# Patient Record
Sex: Female | Born: 1965 | Race: Black or African American | Hispanic: No | Marital: Married | Smoking: Never smoker
Health system: Southern US, Community
[De-identification: ages and names within clinical notes are randomized; demographics above are authoritative.]

## PROBLEM LIST (undated history)

## (undated) DIAGNOSIS — K219 Gastro-esophageal reflux disease without esophagitis: Secondary | ICD-10-CM

## (undated) DIAGNOSIS — I10 Essential (primary) hypertension: Secondary | ICD-10-CM

## (undated) DIAGNOSIS — N189 Chronic kidney disease, unspecified: Secondary | ICD-10-CM

## (undated) DIAGNOSIS — R51 Headache: Secondary | ICD-10-CM

## (undated) DIAGNOSIS — Z1231 Encounter for screening mammogram for malignant neoplasm of breast: Secondary | ICD-10-CM

## (undated) DIAGNOSIS — N200 Calculus of kidney: Secondary | ICD-10-CM

## (undated) HISTORY — PX: ABDOMINAL HYSTERECTOMY: SHX81

---

## 1997-12-24 ENCOUNTER — Other Ambulatory Visit: Admission: RE | Admit: 1997-12-24 | Discharge: 1997-12-24 | Payer: Self-pay | Admitting: Obstetrics and Gynecology

## 1998-01-02 ENCOUNTER — Observation Stay (HOSPITAL_COMMUNITY): Admission: AD | Admit: 1998-01-02 | Discharge: 1998-01-02 | Payer: Self-pay | Admitting: *Deleted

## 1998-07-17 ENCOUNTER — Inpatient Hospital Stay (HOSPITAL_COMMUNITY): Admission: AD | Admit: 1998-07-17 | Discharge: 1998-07-19 | Payer: Self-pay | Admitting: Obstetrics and Gynecology

## 1998-07-25 ENCOUNTER — Inpatient Hospital Stay (HOSPITAL_COMMUNITY): Admission: AD | Admit: 1998-07-25 | Discharge: 1998-07-25 | Payer: Self-pay | Admitting: Obstetrics and Gynecology

## 1998-08-19 ENCOUNTER — Other Ambulatory Visit: Admission: RE | Admit: 1998-08-19 | Discharge: 1998-08-19 | Payer: Self-pay | Admitting: Obstetrics and Gynecology

## 1999-10-06 ENCOUNTER — Other Ambulatory Visit: Admission: RE | Admit: 1999-10-06 | Discharge: 1999-10-06 | Payer: Self-pay | Admitting: Obstetrics and Gynecology

## 2000-10-09 ENCOUNTER — Other Ambulatory Visit: Admission: RE | Admit: 2000-10-09 | Discharge: 2000-10-09 | Payer: Self-pay | Admitting: Obstetrics and Gynecology

## 2000-10-16 ENCOUNTER — Encounter: Payer: Self-pay | Admitting: Obstetrics and Gynecology

## 2000-10-16 ENCOUNTER — Ambulatory Visit (HOSPITAL_COMMUNITY): Admission: RE | Admit: 2000-10-16 | Discharge: 2000-10-16 | Payer: Self-pay | Admitting: Obstetrics and Gynecology

## 2000-11-21 ENCOUNTER — Ambulatory Visit (HOSPITAL_COMMUNITY): Admission: RE | Admit: 2000-11-21 | Discharge: 2000-11-21 | Payer: Self-pay | Admitting: Obstetrics and Gynecology

## 2000-11-21 ENCOUNTER — Encounter (INDEPENDENT_AMBULATORY_CARE_PROVIDER_SITE_OTHER): Payer: Self-pay

## 2001-06-21 ENCOUNTER — Ambulatory Visit (HOSPITAL_COMMUNITY): Admission: RE | Admit: 2001-06-21 | Discharge: 2001-06-21 | Payer: Self-pay | Admitting: Obstetrics and Gynecology

## 2001-10-23 ENCOUNTER — Inpatient Hospital Stay (HOSPITAL_COMMUNITY): Admission: AD | Admit: 2001-10-23 | Discharge: 2001-10-25 | Payer: Self-pay | Admitting: Family Medicine

## 2001-10-23 ENCOUNTER — Encounter: Payer: Self-pay | Admitting: Family Medicine

## 2001-11-26 ENCOUNTER — Other Ambulatory Visit: Admission: RE | Admit: 2001-11-26 | Discharge: 2001-11-26 | Payer: Self-pay | Admitting: Obstetrics and Gynecology

## 2002-09-19 ENCOUNTER — Encounter: Payer: Self-pay | Admitting: Family Medicine

## 2002-09-19 ENCOUNTER — Encounter: Admission: RE | Admit: 2002-09-19 | Discharge: 2002-09-19 | Payer: Self-pay | Admitting: Family Medicine

## 2003-06-18 ENCOUNTER — Encounter: Admission: RE | Admit: 2003-06-18 | Discharge: 2003-06-18 | Payer: Self-pay | Admitting: Family Medicine

## 2004-01-12 ENCOUNTER — Emergency Department (HOSPITAL_COMMUNITY): Admission: EM | Admit: 2004-01-12 | Discharge: 2004-01-13 | Payer: Self-pay | Admitting: Emergency Medicine

## 2006-04-12 ENCOUNTER — Ambulatory Visit: Payer: Self-pay | Admitting: Orthopedic Surgery

## 2006-04-26 ENCOUNTER — Ambulatory Visit: Payer: Self-pay | Admitting: Orthopedic Surgery

## 2006-05-10 ENCOUNTER — Ambulatory Visit: Payer: Self-pay | Admitting: Orthopedic Surgery

## 2006-08-25 ENCOUNTER — Ambulatory Visit: Payer: Self-pay | Admitting: Gastroenterology

## 2006-10-10 ENCOUNTER — Ambulatory Visit: Payer: Self-pay | Admitting: Gastroenterology

## 2007-03-30 ENCOUNTER — Ambulatory Visit (HOSPITAL_COMMUNITY): Admission: RE | Admit: 2007-03-30 | Discharge: 2007-03-31 | Payer: Self-pay | Admitting: Obstetrics and Gynecology

## 2007-03-30 ENCOUNTER — Encounter (INDEPENDENT_AMBULATORY_CARE_PROVIDER_SITE_OTHER): Payer: Self-pay | Admitting: Obstetrics and Gynecology

## 2007-04-10 ENCOUNTER — Encounter: Payer: Self-pay | Admitting: Orthopedic Surgery

## 2009-04-25 HISTORY — PX: FOOT SURGERY: SHX648

## 2010-05-11 ENCOUNTER — Encounter
Admission: RE | Admit: 2010-05-11 | Discharge: 2010-05-11 | Payer: Self-pay | Source: Home / Self Care | Attending: Obstetrics and Gynecology | Admitting: Obstetrics and Gynecology

## 2010-05-26 ENCOUNTER — Encounter: Payer: Self-pay | Admitting: Obstetrics and Gynecology

## 2010-09-07 NOTE — Op Note (Signed)
Suzanne Holloway, Suzanne Holloway                ACCOUNT NO.:  [id number]   MEDICAL RECORD NO.:  [id number]          PATIENT TYPE:  OIB   LOCATION:  9308                          FACILITY:  [facility]   PHYSICIAN:  Lenoard Aden, M.D.DATE OF BIRTH:  07-Feb-1966   DATE OF PROCEDURE:  03/30/2007  DATE OF DISCHARGE:                               OPERATIVE REPORT   PREOPERATIVE DIAGNOSIS:  Symptomatic uterine fibroids with secondary  anemia and pelvic pain.   POSTOPERATIVE DIAGNOSES:  1. Symptomatic uterine fibroids with secondary anemia and pelvic pain.  2. Pelvic adhesions.  3. Enterocele.   PROCEDURES:  1. Total laparoscopic hysterectomy.  2. Lysis of adhesions.  3. McCall culdoplasty.   SURGEON:  Lenoard Aden, M.D.   ASSISTANT:  Lendon Colonel, MD.   ANESTHESIA:  General.   ESTIMATED BLOOD LOSS:  100 mL.   COMPLICATIONS:  None.   DRAINS:  Foley.   COUNTS:  Correct.   The patient to recovery in good condition.   DESCRIPTION OF PROCEDURE:  After being apprised of the risks of  anesthesia, infection, bleeding, injury to abdominal organs with need  for repair, delayed versus immediate complications to include bowel and  bladder injury, the patient brought to the operating room where she was  administered general anesthetic without complications, prepped and  draped in usual sterile fashion.  Foley catheter placed.  Rumi retractor  placed per vagina without difficulty in the standard fashion.  An  infraumbilical incision then made with a scalpel.  Veress needle placed,  opening pressure -1 noted, 3 liters CO2 insufflated without difficulty.  Trocar placed atraumatically.  Pictures taken.  Normal liver,  gallbladder bed, normal appendiceal area.  Normal posterior cul-de-sac,  anterior cul-de-sac with adhesions to the bladder flap noted.  Noted  bilateral normal ovaries and previously divided tubes are noted.  At  this time two 5 mm ports are placed bilaterally in the  midclavicular  line under transillumination and direct visualization.  The left round  ligament is grasped, ligated using the Gyrus, the bladder flap is then  extended down to the level of the bladder where dense scar tissue is  noted.  The tubo-ovarian ligament is grasped and ligated using the  Gyrus, uterine vessels skeletonized on the left and the same procedure  and done on the right whereby the round ligament is grasped, ligated  tubo-ovarian ligament is grasped, ligated with the Gyrus as well.  Uterine vessels are skeletonized.  Attention is then turned to a bladder  flap, whereby this is developed sharply using the Gyrus and Endoshears.  The bladder flap is then sharply deflected after adhesion are lysed  sharply down to the level of the Rumi cup and extended in the standard  fashion exposing the pearly white tissue in the bladder reflection.  At  this time uterine vessels are bilaterally grasped and ligated using  LigaSure and good blanching of the uterus is noted.  At this time  posterior cul-de-sac entry was made using the Gyrus spatula and  hemostasis was achieved using the Gyrus extending circumferentially  around the anterior portion  with good hemostasis.  The specimen is then  retracted out of the vagina and the cuff is made hemostatic using the  Gyrus and good hemostasis noted.  The cuff is then closed front to back  using interrupted 0 Vicryl sutures, sutured laparoscopically.  Enterocele is identified.  Uterosacral ligaments are then plicated in  the midline using a internal McCall culdoplasty suture without  difficulty.  Good hemostasis is noted.  Irrigation is accomplished.  Ureters are noted prehysterectomy and post-hysterectomy to be normal and  peristalsing bilaterally.  Both ovaries appear normal.  All instruments  are removed under direct visualization.  CO2 is released.  Incisional  bleeding is controlled using a 0 Vicryl and 4-0 Vicryl on bilateral  lower  quadrant ports.  Dermabond is placed on the infraumbilical  incision.  Specimen is weighed, weighing 200 grams and sent to  pathology.  The patient tolerates the procedure well, is awakened and  transferred to recovery in good condition.      Lenoard Aden, M.D.  Electronically Signed     RJT/MEDQ  D:  03/30/2007  T:  03/30/2007  Job:  [id number]

## 2010-09-10 NOTE — Op Note (Signed)
[facility]  Patient:    Suzanne Holloway, Suzanne Holloway                       MRN: [id number] Proc. Date: 11/21/00 Adm. Date:  [id number] Attending:  Lenoard Aden                           Operative Report  PREOPERATIVE DIAGNOSIS:       Dysmenorrhea, menorrhagia, and anemia, a                               structural lesion.  POSTOPERATIVE DIAGNOSIS:      A large submucous fibroid.  PROCEDURE:                    Diagnostic hysteroscopy with a resectoscopic                               myomectomy and dilatation and curettage.  SURGEON:                      Lenoard Aden, M.D.  ANESTHESIA:                   General.  FLUID DEFICIT:                60 cc.  COMPLICATIONS:                None.  ESTIMATED BLOOD LOSS:         50 cc.  FINDINGS:                     A large submucous anterior wall fibroid, to pathology.  The patient had normal tubal ostia, normal posterior wall.  The patient to the recovery room in good condition.  DESCRIPTION OF PROCEDURE:     After being appraised of the risks of anesthesia, infection, bleeding, uterine perforation, and a need for repair, the patient was brought to the operating room where she was administered a general anesthetic without complications.  She was prepped and draped in the usual sterile fashion.  She was catheterized until the bladder was empty. Examination under anesthesia revealed a bulky anteflexed uterus.  No adnexal masses.  Dilute Pitressin solution at 3 and 9 oclock in the cervicovaginal junction, placed without difficulty, a total of 15 cc, 20 cc in 100 cc of saline.  At this time the cervix dilated easily up to a #31 Pratt dilator. The diagnostic hysteroscope was placed.  The resection using double angle loop and multiple passes for complete resection of a large anterior wall submucous fibroid, down to the base without difficulty.  Good hemostasis was achieved. Fluid deficit was 70 cc.  Normal tubal  ostia noted.  No evidence of posterior wall lesion noted.  All instruments are removed after a D&C is performed.  The patient tolerated the procedure well and was taken to the recovery room in good condition. DD:  11/21/00 TD:  11/21/00 Job: [id number]

## 2010-09-10 NOTE — H&P (Signed)
[facility]  Patient:    Suzanne Holloway, Suzanne Holloway Visit Number: [id number] MRN: [id number]          Service Type: MED Location: 3000 [id number] [id number] Attending Physician:  Beverely Low Dictated by:   Geraldo Pitter, M.D. Admit Date:  10/23/2001 Discharge Date: 10/25/2001                           History and Physical  HISTORY OF PRESENT ILLNESS: The patient is a 45 year old female, who was first seen in the office on October 22, 2001 with complaints of not feeling well and having frequency of urination.  She states she had had a urinary tract infection in the past and this felt like such.  Examination in the office showed that her urine was positive for bacteria.  She also was noted to have CVA tenderness and some lower abdominal tenderness.  We decided to go ahead and treat her as an outpatient as she had a temperature of only 100.1 degrees at that time and draw blood cultures and do a urine and get a renal ultrasound.  Blood cultures came back positive for gram-negative rods the next day and her renal ultrasound was read as being negative.  The patient was placed on Cipro 750 mg one b.i.d. and Augmentin 850 mg b.i.d.  I received a call from her husband that she was running a TEMP of 102-103 degrees and it was therefore felt that hospitalization was appropriate.  PAST MEDICAL HISTORY:  1. Tubal ligation in 2003.  2. Myomectomy in 2002.  3. Urinary tract infection last year.  MEDICATIONS:  1. Cipro 750 mg b.i.d.  2. Augmentin 875 mg b.i.d.  ALLERGIES: Negative.  FAMILY HISTORY: Positive for hypertension in mother,  heart disease in maternal grandmother, anemia in the patient.  REVIEW OF SYSTEMS: Noncontributory.  SOCIAL HISTORY: Married with children.  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  PHYSICAL EXAMINATION:  VITAL SIGNS: Temperature 103.1 degrees, blood pressure 140/80, pulse 110.  O2 saturation 96.0%.  Respirations  18.  GENERAL: She complained that she could not get warm.  HEENT: Head normocephalic.  EOMI.  PERRL.  Discs flat.  Ears negative.  Nose negative.  Mouth negative with good dentition noted.  NECK: Supple.  No thyromegaly, no carotid bruits heard.  CHEST: Clear anterior and posterior.  BACK: Positive CVA tenderness on the left side, which was extremely tender.  NEUROLOGIC: Alert and oriented x3.  Moving all extremities.  ABDOMEN: Soft, with tenderness noted in the lower abdomen to palpation.  LABORATORY DATA: Laboratories from my office showed a WBC of 8.1, hemoglobin and hematocrit 12.2 and 39.3.  Sodium 137, potassium 4.2, chloride 102, CO2 22.  Blood culture was positive for gram-negative rods.  UA was positive for blood, positive for leukocytes +2, moderate ketones, positive for bilirubin.  Renal ultrasound done at [facility] on an outpatient basis was read as negative.  ASSESSMENT: This is a 45 year old female with probable pyelonephritis, who has failed attempted outpatient therapy.  PLAN: Admit the patient and start IVs and IV antibiotics, and monitor carefully.Dictated by:   Geraldo Pitter, M.D. Attending Physician:  Beverely Low DD:  10/24/01 TD:  10/27/01 Job: [id number]

## 2010-09-10 NOTE — Discharge Summary (Signed)
Suzanne Holloway, Suzanne Holloway                ACCOUNT NO.:  [id number]   MEDICAL RECORD NO.:  [id number]          PATIENT TYPE:  OIB   LOCATION:  9308                          FACILITY:  [facility]   PHYSICIAN:  Lenoard Aden, M.D.DATE OF BIRTH:  January 23, 1966   DATE OF ADMISSION:  03/30/2007  DATE OF DISCHARGE:  03/31/2007                               DISCHARGE SUMMARY   HISTORY OF PRESENT ILLNESS:  The patient underwent uncomplicated total  laparoscopic hysterectomy on March 30, 2007. Postoperative course  uncomplicated. Tolerated diet well. Urinated without difficulty. Foley  removed on postoperative day 1.   DISPOSITION:  To home.   DISCHARGE MEDICATIONS:  1. Tylox.  2. Iron.   FOLLOWUP:  In the office in 2 to 4 weeks.      Lenoard Aden, M.D.  Electronically Signed     RJT/MEDQ  D:  05/22/2007  T:  05/22/2007  Job:  [id number]

## 2010-09-10 NOTE — H&P (Signed)
[facility]  Patient:    Suzanne Holloway, Suzanne Holloway                       MRN: [id number] Adm. Date:  [id number] Attending:  Lenoard Aden                         History and Physical  PREOPERATIVE DIAGNOSIS:       Severe dysmenorrhea and menorrhagia and secondary anemia with saline sonohysterography suggestive of a structural lesion.  HISTORY OF PRESENT ILLNESS:   [age]-year-old African-American female, G2, P2, with a history of C-section x 2, who presents with aforementioned complaint, saline sonohysterography consistent with two endometrial masses, one is a large fundal/anterior wall fibroid, the other being a posterior wall fibroid, who presents for definitive evaluation.  PAST MEDICAL HISTORY:         Remarkable for C-section x 2.  FAMILY HISTORY:               Noncontributory.  BLOOD TYPE:                   O-positive.  SOCIAL HISTORY:               Noncontributory.  SURGICAL HISTORY:             Remarkable for resection of submucous fibroid in 1994, C-section in 1995, tonsillectomy in 1985, laparoscopies in 1990 and 1991.  PHYSICAL EXAMINATION:  GENERAL:                      Well-developed, well-nourished African-American female in no acute distress.  HEENT:                        Normal.  LUNGS:                        Clear.  HEART:                        Regular rhythm.  ABDOMEN:                      Soft, nontender.  PELVIC:                       Exam reveals a bulky anteflexed uterus, no adnexal masses.  EXTREMITIES:                  No cords.  NEUROLOGIC:                   Exam is nonfocal.  IMPRESSION:                   Dysmenorrhea and menorrhagia with secondary                               anemia and questionable structural lesion.  PLAN:                         Proceed with diagnostic hysteroscopy, resectoscopic dilatation and curettage.  Risks of anesthesia, infection, bleeding, injury to abdominal organs with need  for repair are discussed, delayed versus immediate complications to include bowel and bladder injury are noted; patient acknowledges and desires to proceed. DD:  11/21/00 TD:  11/21/00 Job: [id number]

## 2010-09-10 NOTE — Op Note (Signed)
[facility]  Patient:    Suzanne Holloway, COPPESS Visit Number: [id number] MRN: [id number]          Service Type: DSU Location: [facility] Attending Physician:  Lenoard Aden Dictated by:   Lenoard Aden, M.D. Proc. Date: 06/21/01 Admit Date:  06/21/2001   CC:         [facility]   Operative Report  PREOPERATIVE DIAGNOSES:       Desire for elective sterilization.  POSTOPERATIVE DIAGNOSES:      Desire for elective sterilization, pelvic adhesions.  PROCEDURE:                    Laparoscopic tubal sterilization, lysis of adhesions.  SURGEON:                      Lenoard Aden, M.D.  ANESTHESIA:                   General by Jean Rosenthal.  ESTIMATED BLOOD LOSS:         Less than 50 cc.  COMPLICATIONS:                None.  DRAINS:                       None.  COUNTS:                       Correct.  DISPOSITION:                  Patient in recovery in good condition.  PROCEDURE:                    After signing previously noted informed consent, failure risk of tubal ligation 5-10 per 1000 noted, risks of anesthesia, infection, bleeding, and injury to abdominal organs with need for repair is discussed, patient is brought to the operating room where she is administered general anesthesia without complications.  Feet are placed in the yellow thin stirrups.  Prepped and draped in usual sterile fashion.  Hulka tenaculum placed per vagina.  After examination under anesthesia reveals an anteflexed borderline enlarged uterus, bladder is catheterized until empty.  At this time infraumbilical incision made with the scalpel after placement of dilute Marcaine solution.  Verres needle placed.  Opening pressure -2 noted.  CO2 5 L insufflated without difficulty after setting patient pressure to 25.  After achieving adequate pneumoperitoneum and hanging drop test is noted to be negative consistent with intraperitoneal entry of the Verres needle, the trocar  is placed atraumatically.  Pictures taken.  Omental adhesions to the anterior abdominal wall and the midline are noted.  Bowel is identified posterior and Klepinger bipolar cautery is entered through the operative port to cauterize these omental adhesions at their insertion through the abdominal wall and they are then cut using sharp dissection with long scissors.  At this time tubes and ovaries are identified.  Normal anterior and posterior cul-de-sac.  Normal tubes and ovaries.  Right tube is traced out to the fimbriated end, cauterized, and three contiguous portions of the ampullary isthmic section of the tube down to resistance of 0 and divided using scissors.  Tubal lumens are visualized.  Pictures are taken.  Same procedure is performed on the left tube.  Tubal lumens are visualized.  Pictures are taken.  CO2 is released from the abdomen.  CO2 is released.  Trocar  removed under direct visualization.  Incision closed using 0 Vicryl and Dermabond. Please note, upon establishing intraperitoneal entry with the trocar, atraumatic trocar entry was noted at the beginning of the procedure.  Hulka tenaculum removed from the vagina.  Patient tolerates procedure well, is transferred to recovery in good condition. Dictated by:   Lenoard Aden, M.D. Attending Physician:  Lenoard Aden DD:  06/21/01 TD:  06/21/01 Job: [id number]

## 2010-09-10 NOTE — H&P (Signed)
[facility]  Patient:    Suzanne Holloway, Suzanne Holloway Visit Number: [id number] MRN: [id number]          Service Type: DSU Location: [facility] Attending Physician:  Lenoard Aden Dictated by:   Lenoard Aden, M.D. Admit Date:  06/21/2001                           History and Physical  CHIEF COMPLAINT:              Desire for elective sterilization.  HISTORY OF PRESENT ILLNESS:   The patient is a 45 year old African-American female, G2, P2 who presents for desire for elective sterilization. The patient has no known drug allergies, takes no medications.  PAST OBSTETRIC HISTORY:       Cesarean section x2.  SURGICAL HISTORY:             Resectoscopic myomectomy in 2002, history of diagnostic laparoscopy for endometriosis in 1993.  FAMILY HISTORY:               Noncontributory.  SOCIAL HISTORY:               Noncontributory.  PHYSICAL EXAMINATION:  GENERAL:                      Well-developed, well-nourished, African-American female in no apparent distress.  HEENT:                        Normal.  LUNGS:                        Clear.  HEART:                        Regular rhythm.  ABDOMEN:                      Soft, scaphoid, nontender, well healed, Pfannenstiel skin incision and infraumbilical scar noted.  PELVIC:                       Reveals a normal size uterus, no adnexal masses.  IMPRESSION:                   Desire for elective sterilization.  PLAN:                         Proceed with laparoscopic tubal sterilization. The risks of anesthesia, infection, bleeding, injury to abdominal organs and need for repair discussed, delayed versus immediate complications to include bowel and bladder injury with need for repair noted. The patient acknowledges and desires to proceed. Failure risk of tubal ligation 5-01/999 noted. Dictated by:   Lenoard Aden, M.D. Attending Physician:  Lenoard Aden DD:  06/21/01 TD:  06/21/01 Job:  [id number]

## 2010-12-28 ENCOUNTER — Other Ambulatory Visit: Payer: Self-pay | Admitting: Obstetrics and Gynecology

## 2010-12-28 DIAGNOSIS — Z1231 Encounter for screening mammogram for malignant neoplasm of breast: Secondary | ICD-10-CM

## 2011-01-10 ENCOUNTER — Ambulatory Visit
Admission: RE | Admit: 2011-01-10 | Discharge: 2011-01-10 | Disposition: A | Discharge status: Home / Self Care | Payer: BC Managed Care – PPO | Source: Ambulatory Visit | Attending: Obstetrics and Gynecology | Admitting: Obstetrics and Gynecology

## 2011-01-10 DIAGNOSIS — Z1231 Encounter for screening mammogram for malignant neoplasm of breast: Secondary | ICD-10-CM

## 2011-01-31 LAB — CBC
MCV: 89.4
MCV: 90.1
Platelets: 240
RBC: 2.98 — ABNORMAL LOW
RBC: 3.8 — ABNORMAL LOW
WBC: 10.2
WBC: 4.6

## 2011-01-31 LAB — HCG, SERUM, QUALITATIVE: Preg, Serum: NEGATIVE

## 2012-01-16 ENCOUNTER — Other Ambulatory Visit: Payer: Self-pay | Admitting: Obstetrics and Gynecology

## 2012-01-16 DIAGNOSIS — Z1231 Encounter for screening mammogram for malignant neoplasm of breast: Secondary | ICD-10-CM

## 2012-02-15 ENCOUNTER — Ambulatory Visit
Admission: RE | Admit: 2012-02-15 | Discharge: 2012-02-15 | Disposition: A | Discharge status: Home / Self Care | Payer: BC Managed Care – PPO | Source: Ambulatory Visit | Attending: Obstetrics and Gynecology | Admitting: Obstetrics and Gynecology

## 2012-02-15 DIAGNOSIS — Z1231 Encounter for screening mammogram for malignant neoplasm of breast: Secondary | ICD-10-CM

## 2012-06-30 ENCOUNTER — Emergency Department (HOSPITAL_COMMUNITY)
Admission: EM | Admit: 2012-06-30 | Discharge: 2012-07-01 | Disposition: A | Discharge status: Home / Self Care | Payer: BC Managed Care – PPO | Attending: Emergency Medicine | Admitting: Emergency Medicine

## 2012-06-30 ENCOUNTER — Encounter (HOSPITAL_COMMUNITY): Payer: Self-pay

## 2012-06-30 ENCOUNTER — Emergency Department (HOSPITAL_COMMUNITY): Payer: BC Managed Care – PPO

## 2012-06-30 DIAGNOSIS — M549 Dorsalgia, unspecified: Secondary | ICD-10-CM | POA: Insufficient documentation

## 2012-06-30 DIAGNOSIS — Z90710 Acquired absence of both cervix and uterus: Secondary | ICD-10-CM | POA: Insufficient documentation

## 2012-06-30 DIAGNOSIS — Z79899 Other long term (current) drug therapy: Secondary | ICD-10-CM | POA: Insufficient documentation

## 2012-06-30 DIAGNOSIS — R6883 Chills (without fever): Secondary | ICD-10-CM | POA: Insufficient documentation

## 2012-06-30 DIAGNOSIS — R1110 Vomiting, unspecified: Secondary | ICD-10-CM | POA: Insufficient documentation

## 2012-06-30 DIAGNOSIS — N201 Calculus of ureter: Secondary | ICD-10-CM | POA: Insufficient documentation

## 2012-06-30 LAB — CBC WITH DIFFERENTIAL/PLATELET
Eosinophils Relative: 1 % (ref 0–5)
HCT: 39.3 % (ref 36.0–46.0)
Hemoglobin: 13.3 g/dL (ref 12.0–15.0)
Lymphocytes Relative: 10 % — ABNORMAL LOW (ref 12–46)
Lymphs Abs: 1.1 10*3/uL (ref 0.7–4.0)
MCV: 93.6 fL (ref 78.0–100.0)
Monocytes Absolute: 0.4 10*3/uL (ref 0.1–1.0)
Monocytes Relative: 4 % (ref 3–12)
RBC: 4.2 MIL/uL (ref 3.87–5.11)
WBC: 11.4 10*3/uL — ABNORMAL HIGH (ref 4.0–10.5)

## 2012-06-30 LAB — COMPREHENSIVE METABOLIC PANEL
CO2: 29 mEq/L (ref 19–32)
Calcium: 9.3 mg/dL (ref 8.4–10.5)
Chloride: 99 mEq/L (ref 96–112)
Creatinine, Ser: 0.77 mg/dL (ref 0.50–1.10)
GFR calc Af Amer: >90 mL/min (ref >90)
GFR calc non Af Amer: >90 mL/min (ref >90)
Glucose, Bld: 108 mg/dL — ABNORMAL HIGH (ref 70–99)
Total Bilirubin: 0.6 mg/dL (ref 0.3–1.2)

## 2012-06-30 MED ORDER — HYDROMORPHONE HCL PF 1 MG/ML IJ SOLN
1.0000 mg | Freq: Once | INTRAMUSCULAR | Status: AC
  Administered 2012-06-30: 1 mg via INTRAVENOUS
  Filled 2012-06-30: qty 1

## 2012-06-30 MED ORDER — NAPROXEN 500 MG PO TABS: 500.0000 mg | ORAL_TABLET | Freq: Two times a day (BID) | ORAL | Status: DC

## 2012-06-30 MED ORDER — ONDANSETRON HCL 4 MG/2ML IJ SOLN
INTRAMUSCULAR | Status: AC
  Administered 2012-06-30: 4 mg via INTRAVENOUS
  Filled 2012-06-30: qty 2

## 2012-06-30 MED ORDER — SODIUM CHLORIDE 0.9 % IV SOLN: INTRAVENOUS | Status: DC

## 2012-06-30 MED ORDER — OXYCODONE-ACETAMINOPHEN 5-325 MG PO TABS: 1.0000 | ORAL_TABLET | Freq: Four times a day (QID) | ORAL | Status: DC | PRN

## 2012-06-30 MED ORDER — ONDANSETRON HCL 4 MG/2ML IJ SOLN
4.0000 mg | Freq: Once | INTRAMUSCULAR | Status: AC
  Administered 2012-06-30: 4 mg via INTRAVENOUS
  Filled 2012-06-30: qty 2

## 2012-06-30 MED ORDER — IOHEXOL 300 MG/ML  SOLN
100.0000 mL | Freq: Once | INTRAMUSCULAR | Status: AC | PRN
  Administered 2012-06-30: 100 mL via INTRAVENOUS

## 2012-06-30 MED ORDER — ONDANSETRON HCL 4 MG/2ML IJ SOLN
4.0000 mg | Freq: Once | INTRAMUSCULAR | Status: AC
  Administered 2012-06-30: 4 mg via INTRAVENOUS

## 2012-06-30 MED ORDER — SODIUM CHLORIDE 0.9 % IV BOLUS (SEPSIS)
1000.0000 mL | Freq: Once | INTRAVENOUS | Status: AC
  Administered 2012-06-30: 1000 mL via INTRAVENOUS

## 2012-06-30 MED ORDER — PROMETHAZINE HCL 25 MG PO TABS: 25.0000 mg | ORAL_TABLET | Freq: Four times a day (QID) | ORAL | Status: DC | PRN

## 2012-06-30 MED ORDER — IOHEXOL 300 MG/ML  SOLN
50.0000 mL | Freq: Once | INTRAMUSCULAR | Status: AC | PRN
  Administered 2012-06-30: 50 mL via ORAL

## 2012-06-30 NOTE — ED Provider Notes (Signed)
History  This chart was scribed for Shelda Jakes, MD by Erskine Emery, ED Scribe. This patient was seen in room [facility] and the patient's care was started at 20:40.   CSN: [id number]  Arrival date & time 06/30/12  8413   First MD Initiated Contact with Patient 06/30/12 2040      Chief Complaint  Patient presents with  . Flank Pain  . Emesis    (Consider location/radiation/quality/duration/timing/severity/associated sxs/prior treatment) The history is provided by the patient. No language interpreter was used.  Suzanne Holloway is a 47 y.o. female who presents to the Emergency Department complaining of right flank and RLQ abdominal pain, suddenly worsened at 18:00 this evening. Pt reports some associated emesis (4 episodes), chills, and mild back pains for a couple days earlier in the week that have since subsided. Pt denies any h/o kidney stones, dysuria, hematuria. fever, visual changes, cough, rhinorrhea, sore throat, chest pain, difficulty breathing, leg swelling, rash, neck pain, vaginal bleeding, vaginal discharge, headache, or h/o bleeding easily.  Pt's PCP is Donnita Falls, NP in [place].  History reviewed. No pertinent past medical history.  Past Surgical History  Procedure Laterality Date  . Abdominal hysterectomy    . Cesarean section      x 2    History reviewed. No pertinent family history.  History  Substance Use Topics  . Smoking status: Never Smoker   . Smokeless tobacco: Not on file  . Alcohol Use: No    OB History   Grav Para Term Preterm Abortions TAB SAB Ect Mult Living                  Review of Systems  Constitutional: Positive for chills.  HENT: Negative for congestion, rhinorrhea and neck pain.   Eyes: Negative for visual disturbance.  Respiratory: Negative for cough and shortness of breath.   Cardiovascular: Negative for chest pain.  Gastrointestinal: Positive for vomiting. Negative for abdominal pain.  Genitourinary: Positive for  flank pain. Negative for dysuria and hematuria.  Musculoskeletal: Positive for back pain.  Skin: Negative for rash.  Neurological: Negative for headaches.  Hematological: Does not bruise/bleed easily.  All other systems reviewed and are negative.    Allergies  Review of patient's allergies indicates no known allergies.  Home Medications   Current Outpatient Rx  Name  Route  Sig  Dispense  Refill  . amLODipine-valsartan (EXFORGE) 10-160 MG per tablet   Oral   Take 1 tablet by mouth every morning.         . ergocalciferol (VITAMIN D2) 50000 UNITS capsule   Oral   Take 50,000 Units by mouth 2 (two) times a week.         Marland Kitchen ibuprofen (ADVIL,MOTRIN) 200 MG tablet   Oral   Take 400 mg by mouth as needed for pain.         . Multiple Vitamin (MULTIVITAMIN WITH MINERALS) TABS   Oral   Take 1 tablet by mouth every morning.         Marland Kitchen omeprazole (PRILOSEC) 40 MG capsule   Oral   Take 40 mg by mouth every morning.           There were no vitals taken for this visit.  Physical Exam  Nursing note and vitals reviewed. Constitutional: She is oriented to person, place, and time. She appears well-developed and well-nourished. No distress.  HENT:  Head: Normocephalic and atraumatic.  Eyes: Conjunctivae and EOM are normal. Pupils are equal, round,  and reactive to light. No scleral icterus.  Neck: Neck supple. No tracheal deviation present.  Cardiovascular: Normal rate, regular rhythm and normal heart sounds.   Pulmonary/Chest: Effort normal and breath sounds normal. No respiratory distress. She has no wheezes.  Lungs are clear.  Abdominal: Soft. Bowel sounds are normal. She exhibits no distension. There is tenderness.  RLQ tenderness.  Musculoskeletal: Normal range of motion. She exhibits no edema.  Neurological: She is alert and oriented to person, place, and time. No cranial nerve deficit. Coordination normal.  Skin: Skin is warm and dry.  Psychiatric: She has a normal  mood and affect.    ED Course  Procedures (including critical care time) DIAGNOSTIC STUDIES:  COORDINATION OF CARE: 20:58--I evaluated the patient and we discussed a treatment plan including pain medication, abdominal CT scans, and urinalysis to which the pt agreed.    Labs Reviewed  CBC WITH DIFFERENTIAL - Abnormal; Notable for the following:    WBC 11.4 (*)    Neutrophils Relative 86 (*)    Neutro Abs 9.8 (*)    Lymphocytes Relative 10 (*)    All other components within normal limits  COMPREHENSIVE METABOLIC PANEL - Abnormal; Notable for the following:    Glucose, Bld 108 (*)    All other components within normal limits  LIPASE, BLOOD  URINALYSIS, ROUTINE W REFLEX MICROSCOPIC   Ct Abdomen Pelvis W Contrast  06/30/2012  *RADIOLOGY REPORT*  Clinical Data: Flank pain, emesis  CT ABDOMEN AND PELVIS WITH CONTRAST  Technique:  Multidetector CT imaging of the abdomen and pelvis was performed following the standard protocol during bolus administration of intravenous contrast.  Contrast: 50mL OMNIPAQUE IOHEXOL 300 MG/ML  SOLN, OMNIPAQUE IOHEXOL 300 MG/ML  SOLN  Comparison: None.  Findings:  Lower Chest:  Lung bases are clear.  Visualized cardiac structures are within normal limits for size.  Small hiatal hernia.  Abdomen: Unremarkable CT appearance of the stomach, duodenum, spleen, adrenal glands and pancreas.  Small circumscribed sub centimeter hypoattenuating lesion in the right hepatic dome is most consistent with a benign cyst or biliary hamartoma.  No additional focal hepatic lesions identified. Gallbladder is unremarkable. No intra or extrahepatic biliary ductal dilatation.  Mild right hydronephrosis.  There is a six by 9 cm partially obstructing proximal ureteral stone.  On the delayed series, there is some passage of contrast material into the ureter distal to this stone.  A punctate 1 - 2 mm nonobstructing stone is identified in the lower pole of the right kidney.  No hydronephrosis  on the left. There are too small punctate 1 - 2 mm stones in the interpolar collecting system.  No focal renal lesion.  Normal-caliber large and small bowel throughout the abdomen.  No significant colonic diverticular disease.  Normal appendix in the right lower quadrant.   No free fluid or suspicious adenopathy.  Pelvis: The urinary bladder is distended with urine. No distal ureteral stone.  Surgical changes of prior hysterectomy.  The ovaries are identified and appear within normal limits for a reproductive age female.  Bones: No acute fracture or aggressive appearing lytic or blastic osseous lesion.  Vascular: No significant atherosclerotic vascular disease.  IMPRESSION:  1. Partially obstructing 6 x 9 mm proximal right ureteral stone with associated mild - moderate right hydronephrosis.  2.  Additional punctate nonobstructing renal calculi bilaterally.   Original Report Authenticated By: Malachy Moan, M.D.    Results for orders placed during the hospital encounter of 06/30/12  CBC WITH DIFFERENTIAL  Result Value Range   WBC 11.4 (*) 4.0 - 10.5 K/uL   RBC 4.20  3.87 - 5.11 MIL/uL   Hemoglobin 13.3  12.0 - 15.0 g/dL   HCT 29.5  62.1 - 30.8 %   MCV 93.6  78.0 - 100.0 fL   MCH 31.7  26.0 - 34.0 pg   MCHC 33.8  30.0 - 36.0 g/dL   RDW 65.7  84.6 - 96.2 %   Platelets 219  150 - 400 K/uL   Neutrophils Relative 86 (*) 43 - 77 %   Neutro Abs 9.8 (*) 1.7 - 7.7 K/uL   Lymphocytes Relative 10 (*) 12 - 46 %   Lymphs Abs 1.1  0.7 - 4.0 K/uL   Monocytes Relative 4  3 - 12 %   Monocytes Absolute 0.4  0.1 - 1.0 K/uL   Eosinophils Relative 1  0 - 5 %   Eosinophils Absolute 0.1  0.0 - 0.7 K/uL   Basophils Relative 0  0 - 1 %   Basophils Absolute 0.0  0.0 - 0.1 K/uL  COMPREHENSIVE METABOLIC PANEL      Result Value Range   Sodium 136  135 - 145 mEq/L   Potassium 3.9  3.5 - 5.1 mEq/L   Chloride 99  96 - 112 mEq/L   CO2 29  19 - 32 mEq/L   Glucose, Bld 108 (*) 70 - 99 mg/dL   BUN 15  6 - 23 mg/dL    Creatinine, Ser 9.52  0.50 - 1.10 mg/dL   Calcium 9.3  8.4 - 84.1 mg/dL   Total Protein 7.3  6.0 - 8.3 g/dL   Albumin 3.8  3.5 - 5.2 g/dL   AST 17  0 - 37 U/L   ALT 16  0 - 35 U/L   Alkaline Phosphatase 69  39 - 117 U/L   Total Bilirubin 0.6  0.3 - 1.2 mg/dL   GFR calc non Af Amer >90  >90 mL/min   GFR calc Af Amer >90  >90 mL/min  LIPASE, BLOOD      Result Value Range   Lipase 34  11 - 59 U/L     1. Right ureteral stone       MDM  CT shows right ureteral stone. Large in size 6 x 9 mm may be difficult to pass. No evidence of any renal insufficiency. Patient's first stone. Will refer to urology. Treat with pain medication. Stone is partially obstructing.      I personally performed the services described in this documentation, which was scribed in my presence. The recorded information has been reviewed and is accurate.     Shelda Jakes, MD 06/30/12 [phone]

## 2012-06-30 NOTE — ED Notes (Signed)
Started having pain in right flank around 1830 today, started vomiting soon after pain started per pt.

## 2012-07-06 ENCOUNTER — Other Ambulatory Visit: Payer: Self-pay | Admitting: Urology

## 2012-07-06 ENCOUNTER — Encounter (HOSPITAL_COMMUNITY): Payer: Self-pay | Admitting: *Deleted

## 2012-07-06 NOTE — Pre-Procedure Instructions (Signed)
Asked to bring blue folder the day of the procedure,insurance card,I.D. driver's license,wear comfortable clothing and have a driver for the day. Asked not to take Advil,Motrin,Ibuprofen,Aleve or any NSAIDS, Aspirin, or Toradol for 72 hours prior to procedure,  No vitamins or herbal medications 7 days prior to procedure. Instructed to take laxative per doctor's office instructions and eat a light dinner the evening before procedure.   To arrive at 1030 for lithotripsy procedure.  

## 2012-07-09 ENCOUNTER — Ambulatory Visit (HOSPITAL_COMMUNITY): Payer: BC Managed Care – PPO

## 2012-07-09 ENCOUNTER — Ambulatory Visit (HOSPITAL_COMMUNITY)
Admission: RE | Admit: 2012-07-09 | Discharge: 2012-07-09 | Disposition: A | Discharge status: Home / Self Care | Payer: BC Managed Care – PPO | Source: Ambulatory Visit | Attending: Urology | Admitting: Urology

## 2012-07-09 ENCOUNTER — Encounter (HOSPITAL_COMMUNITY): Payer: Self-pay | Admitting: *Deleted

## 2012-07-09 ENCOUNTER — Encounter (HOSPITAL_COMMUNITY)
Admission: RE | Disposition: A | Discharge status: Home / Self Care | Payer: Self-pay | Source: Ambulatory Visit | Attending: Urology

## 2012-07-09 DIAGNOSIS — I10 Essential (primary) hypertension: Secondary | ICD-10-CM | POA: Insufficient documentation

## 2012-07-09 DIAGNOSIS — J45909 Unspecified asthma, uncomplicated: Secondary | ICD-10-CM | POA: Insufficient documentation

## 2012-07-09 DIAGNOSIS — N1330 Unspecified hydronephrosis: Secondary | ICD-10-CM | POA: Insufficient documentation

## 2012-07-09 DIAGNOSIS — N201 Calculus of ureter: Secondary | ICD-10-CM | POA: Insufficient documentation

## 2012-07-09 HISTORY — DX: Gastro-esophageal reflux disease without esophagitis: K21.9

## 2012-07-09 HISTORY — DX: Headache: R51

## 2012-07-09 HISTORY — DX: Essential (primary) hypertension: I10

## 2012-07-09 HISTORY — DX: Chronic kidney disease, unspecified: N18.9

## 2012-07-09 SURGERY — LITHOTRIPSY, ESWL
Anesthesia: LOCAL | Laterality: Right

## 2012-07-09 MED ORDER — DIPHENHYDRAMINE HCL 25 MG PO CAPS
25.0000 mg | ORAL_CAPSULE | ORAL | Status: AC
  Administered 2012-07-09: 25 mg via ORAL
  Filled 2012-07-09: qty 1

## 2012-07-09 MED ORDER — CIPROFLOXACIN HCL 500 MG PO TABS
500.0000 mg | ORAL_TABLET | ORAL | Status: AC
  Administered 2012-07-09: 500 mg via ORAL
  Filled 2012-07-09: qty 1

## 2012-07-09 MED ORDER — DIAZEPAM 5 MG PO TABS
10.0000 mg | ORAL_TABLET | ORAL | Status: AC
  Administered 2012-07-09: 10 mg via ORAL
  Filled 2012-07-09: qty 2

## 2012-07-09 MED ORDER — OXYCODONE-ACETAMINOPHEN 5-325 MG PO TABS: 1.0000 | ORAL_TABLET | Freq: Once | ORAL | Status: DC

## 2012-07-09 MED ORDER — DEXTROSE-NACL 5-0.45 % IV SOLN
INTRAVENOUS | Status: DC
  Administered 2012-07-09: 12:00:00 via INTRAVENOUS

## 2012-07-09 NOTE — H&P (Signed)
History of Present Illness     Ms Bottenfield was seen in the ER at [facility] on 3/8 with sudden onset of severe right flank pain associated with nausea, vomiting, chills.  CT scan showed bilateral non obstructing small renal calculi, mild hydronephrosis and a 6 x 9 mm right proximal ureteral calculus.  She was discharged home on Naproxen, antiemetics.  In 2004 she was found on CT scan to have 1 2 mm calculus in each kidney.  She has had mildd flank pain on and off since then.  She has not passed a stone.  She denies frequency, urgency, dysuria, hematuria.   Past Medical History Problems  1. History of  Asthma 493.90 2. History of  Heartburn 787.1 3. History of  Hypertension 401.9  Surgical History Problems  1. History of  Cesarean Section 2. History of  Hysterectomy V45.77  Current Meds 1. Calcium/Vitamin D/Minerals TABS; Therapy: (Recorded:13Mar2014) to 2. Exforge 10-160 MG Oral Tablet; Therapy: (Recorded:13Mar2014) to 3. Multi-Vitamin TABS; Therapy: (Recorded:13Mar2014) to 4. Omeprazole 40 MG Oral Capsule Delayed Release; Therapy: (Recorded:13Mar2014) to 5. Vitamin D TABS; Therapy: (Recorded:13Mar2014) to  Allergies Medication  1. No Known Drug Allergies  Family History Problems  1. Family history of  Family Health Status - Mother's Age 57 2. Family history of  Family Health Status Number Of Children 1 son/ 1 daughter 3. Family history of  Father Deceased At Age 57 MVA 4. Family history of  Hypertension V17.49  Social History Problems    Alcohol Use 1 glass a wk   Caffeine Use 1   Marital History - Currently Married   Never A Smoker   Occupation: Special educational needs teacher  Review of Systems Genitourinary, constitutional, skin, eye, otolaryngeal, hematologic/lymphatic, cardiovascular, pulmonary, endocrine, musculoskeletal, gastrointestinal, neurological and psychiatric system(s) were reviewed and pertinent findings if present are noted.  Genitourinary: nocturia.   Gastrointestinal: nausea, vomiting and abdominal pain.  Constitutional: feeling tired (fatigue).  Musculoskeletal: back pain.    Vitals Vital Signs [Data Includes: Last 1 Day]  13Mar2014 02:31PM  BMI Calculated: 29.99 BSA Calculated: 1.89 Height: 5 ft 5 in Weight: 180 lb  Blood Pressure: 122 / 83 Heart Rate: 65 Respiration: 18  Physical Exam Constitutional: Well nourished and well developed . No acute distress.  ENT:. The ears and nose are normal in appearance.  Neck: The appearance of the neck is normal and no neck mass is present.  Pulmonary: No respiratory distress and normal respiratory rhythm and effort.  Cardiovascular: Heart rate and rhythm are normal . No peripheral edema.  Abdomen: The abdomen is soft and nontender. No masses are palpated. No CVA tenderness. No hernias are palpable. No hepatosplenomegaly noted.  Genitourinary:  The bladder is not distended.  Lymphatics: The femoral and inguinal nodes are not enlarged or tender.  Skin: Normal skin turgor, no visible rash and no visible skin lesions.  Neuro/Psych:. Mood and affect are appropriate.    Results/Data Urine [Data Includes: Last 1 Day]   13Mar2014  COLOR YELLOW   APPEARANCE CLOUDY   SPECIFIC GRAVITY 1.025   pH 6.0   GLUCOSE NEG mg/dL  BILIRUBIN NEG   KETONE NEG mg/dL  BLOOD TRACE   PROTEIN NEG mg/dL  UROBILINOGEN 0.2 mg/dL  NITRITE NEG   LEUKOCYTE ESTERASE TRACE   SQUAMOUS EPITHELIAL/HPF MANY   WBC 7-10 WBC/hpf  RBC 0-2 RBC/hpf  BACTERIA FEW   CRYSTALS NONE SEEN   CASTS NONE SEEN     I independently reviewed the CT scan and the findings  are as noted in the HPI.   Assessment Assessed  1. Proximal Ureteral Stone On The Right 592.1 2. Hydronephrosis On The Right 591 3. Nephrolithiasis Of Both Kidneys 592.0  Plan Health Maintenance (V70.0)  1. UA With REFLEX  Done: 13Mar2014 02:09PM Proximal Ureteral Stone On The Right (592.1)  2. Hydrocodone-Acetaminophen 5-325 MG Oral Tablet; TAKE 1 TO 2  TABLETS BY MOUTH EVERY 4  TO 6 HOURS AS NEEDED FOR PAIN; Therapy: 13Mar2014 to (Evaluate:23Mar2014); Last  Rx:13Mar2014 3. Follow-up Schedule Surgery Office  Follow-up  Done: 13Mar2014   Treatment options were discussed with Ms Rodriges: ESL versus ureteroscopy.  I told her that SL is the least invasive of those procedures in view of the stone location.  She agrees to proceed.  The risks of ESL include but are not limited to hemorrhage, renal or perirenal hematoma, inability to fragment the stone, steinstrasse.  She understands and gives her consent.   Signatures Electronically signed by : Su Grand, M.D.; Jul 05 2012  5:29PM

## 2012-07-09 NOTE — Progress Notes (Signed)
Pt took own med of Percocet po 5/325mg  x1

## 2012-07-09 NOTE — Op Note (Signed)
Refer to [facility] Op Note scanned in the chart 

## 2013-02-25 ENCOUNTER — Other Ambulatory Visit: Payer: Self-pay

## 2013-02-25 DIAGNOSIS — Z1231 Encounter for screening mammogram for malignant neoplasm of breast: Secondary | ICD-10-CM

## 2013-02-28 ENCOUNTER — Other Ambulatory Visit: Payer: Self-pay

## 2013-03-25 ENCOUNTER — Ambulatory Visit: Payer: BC Managed Care – PPO

## 2013-05-17 ENCOUNTER — Other Ambulatory Visit: Payer: Self-pay | Admitting: Obstetrics and Gynecology

## 2013-05-17 ENCOUNTER — Ambulatory Visit
Admission: RE | Admit: 2013-05-17 | Discharge: 2013-05-17 | Disposition: A | Discharge status: Home / Self Care | Payer: BC Managed Care – PPO | Source: Ambulatory Visit

## 2013-05-17 DIAGNOSIS — Z1231 Encounter for screening mammogram for malignant neoplasm of breast: Secondary | ICD-10-CM

## 2013-05-17 DIAGNOSIS — M7989 Other specified soft tissue disorders: Secondary | ICD-10-CM

## 2013-05-17 DIAGNOSIS — N644 Mastodynia: Secondary | ICD-10-CM

## 2013-05-20 ENCOUNTER — Other Ambulatory Visit: Payer: Self-pay | Admitting: Obstetrics and Gynecology

## 2013-05-20 DIAGNOSIS — N644 Mastodynia: Secondary | ICD-10-CM

## 2013-05-20 DIAGNOSIS — M7989 Other specified soft tissue disorders: Secondary | ICD-10-CM

## 2013-05-24 ENCOUNTER — Ambulatory Visit
Admission: RE | Admit: 2013-05-24 | Discharge: 2013-05-24 | Disposition: A | Discharge status: Home / Self Care | Payer: BC Managed Care – PPO | Source: Ambulatory Visit | Attending: Obstetrics and Gynecology | Admitting: Obstetrics and Gynecology

## 2013-05-24 DIAGNOSIS — M7989 Other specified soft tissue disorders: Secondary | ICD-10-CM

## 2013-05-24 DIAGNOSIS — N644 Mastodynia: Secondary | ICD-10-CM

## 2013-05-27 ENCOUNTER — Ambulatory Visit: Payer: BC Managed Care – PPO

## 2013-12-23 ENCOUNTER — Inpatient Hospital Stay (HOSPITAL_COMMUNITY)
Admission: EM | Admit: 2013-12-23 | Discharge: 2013-12-24 | DRG: 379 | Disposition: A | Discharge status: Home / Self Care | Payer: BC Managed Care – PPO | Attending: Internal Medicine | Admitting: Internal Medicine

## 2013-12-23 ENCOUNTER — Telehealth: Payer: Self-pay | Admitting: Gastroenterology

## 2013-12-23 ENCOUNTER — Emergency Department (HOSPITAL_COMMUNITY): Payer: BC Managed Care – PPO

## 2013-12-23 ENCOUNTER — Encounter (HOSPITAL_COMMUNITY): Payer: Self-pay | Admitting: Emergency Medicine

## 2013-12-23 DIAGNOSIS — N189 Chronic kidney disease, unspecified: Secondary | ICD-10-CM | POA: Diagnosis present

## 2013-12-23 DIAGNOSIS — K219 Gastro-esophageal reflux disease without esophagitis: Secondary | ICD-10-CM | POA: Diagnosis present

## 2013-12-23 DIAGNOSIS — I10 Essential (primary) hypertension: Secondary | ICD-10-CM | POA: Diagnosis present

## 2013-12-23 DIAGNOSIS — D649 Anemia, unspecified: Secondary | ICD-10-CM | POA: Diagnosis present

## 2013-12-23 DIAGNOSIS — K269 Duodenal ulcer, unspecified as acute or chronic, without hemorrhage or perforation: Secondary | ICD-10-CM | POA: Diagnosis present

## 2013-12-23 DIAGNOSIS — K2960 Other gastritis without bleeding: Secondary | ICD-10-CM | POA: Diagnosis present

## 2013-12-23 DIAGNOSIS — I129 Hypertensive chronic kidney disease with stage 1 through stage 4 chronic kidney disease, or unspecified chronic kidney disease: Secondary | ICD-10-CM | POA: Diagnosis present

## 2013-12-23 DIAGNOSIS — K922 Gastrointestinal hemorrhage, unspecified: Principal | ICD-10-CM | POA: Diagnosis present

## 2013-12-23 DIAGNOSIS — K5900 Constipation, unspecified: Secondary | ICD-10-CM | POA: Diagnosis present

## 2013-12-23 DIAGNOSIS — K921 Melena: Secondary | ICD-10-CM | POA: Diagnosis present

## 2013-12-23 DIAGNOSIS — K222 Esophageal obstruction: Secondary | ICD-10-CM | POA: Diagnosis present

## 2013-12-23 LAB — CBC WITH DIFFERENTIAL/PLATELET
BASOS ABS: 0 10*3/uL (ref 0.0–0.1)
BASOS PCT: 0 % (ref 0–1)
EOS ABS: 0.2 10*3/uL (ref 0.0–0.7)
EOS PCT: 2 % (ref 0–5)
HEMATOCRIT: 26.8 % — AB (ref 36.0–46.0)
Hemoglobin: 9 g/dL — ABNORMAL LOW (ref 12.0–15.0)
Lymphocytes Relative: 32 % (ref 12–46)
Lymphs Abs: 2.1 10*3/uL (ref 0.7–4.0)
MCH: 32.5 pg (ref 26.0–34.0)
MCHC: 33.6 g/dL (ref 30.0–36.0)
MCV: 96.8 fL (ref 78.0–100.0)
MONO ABS: 0.5 10*3/uL (ref 0.1–1.0)
Monocytes Relative: 7 % (ref 3–12)
NEUTROS ABS: 3.9 10*3/uL (ref 1.7–7.7)
Neutrophils Relative %: 59 % (ref 43–77)
Platelets: 172 10*3/uL (ref 150–400)
RBC: 2.77 MIL/uL — ABNORMAL LOW (ref 3.87–5.11)
RDW: 12.4 % (ref 11.5–15.5)
WBC: 6.6 10*3/uL (ref 4.0–10.5)

## 2013-12-23 LAB — COMPREHENSIVE METABOLIC PANEL
ALBUMIN: 3.3 g/dL — AB (ref 3.5–5.2)
ALT: 12 U/L (ref 0–35)
ANION GAP: 10 (ref 5–15)
AST: 16 U/L (ref 0–37)
Alkaline Phosphatase: 66 U/L (ref 39–117)
BUN: 20 mg/dL (ref 6–23)
CALCIUM: 8.3 mg/dL — AB (ref 8.4–10.5)
CHLORIDE: 104 meq/L (ref 96–112)
CO2: 27 mEq/L (ref 19–32)
CREATININE: 0.68 mg/dL (ref 0.50–1.10)
GFR calc Af Amer: >90 mL/min (ref >90)
GFR calc non Af Amer: >90 mL/min (ref >90)
Glucose, Bld: 85 mg/dL (ref 70–99)
Potassium: 3.4 mEq/L — ABNORMAL LOW (ref 3.7–5.3)
Sodium: 141 mEq/L (ref 137–147)
TOTAL PROTEIN: 6.2 g/dL (ref 6.0–8.3)
Total Bilirubin: 0.5 mg/dL (ref 0.3–1.2)

## 2013-12-23 LAB — LIPASE, BLOOD: LIPASE: 32 U/L (ref 11–59)

## 2013-12-23 LAB — TYPE AND SCREEN
ABO/RH(D): O POS
ANTIBODY SCREEN: NEGATIVE

## 2013-12-23 LAB — PROTIME-INR
INR: 1.08 (ref 0.00–1.49)
Prothrombin Time: 14 seconds (ref 11.6–15.2)

## 2013-12-23 LAB — APTT: APTT: 25 s (ref 24–37)

## 2013-12-23 MED ORDER — DEXTROSE-NACL 5-0.45 % IV SOLN: INTRAVENOUS | Status: DC

## 2013-12-23 MED ORDER — ONDANSETRON HCL 4 MG/2ML IJ SOLN: 4.0000 mg | Freq: Four times a day (QID) | INTRAMUSCULAR | Status: DC | PRN

## 2013-12-23 MED ORDER — SODIUM CHLORIDE 0.9 % IJ SOLN
3.0000 mL | Freq: Two times a day (BID) | INTRAMUSCULAR | Status: DC
Start: 2013-12-23 — End: 2013-12-24
  Administered 2013-12-24: 3 mL via INTRAVENOUS

## 2013-12-23 MED ORDER — AMLODIPINE BESYLATE 5 MG PO TABS
10.0000 mg | ORAL_TABLET | Freq: Every day | ORAL | Status: DC
  Administered 2013-12-24: 10 mg via ORAL
  Filled 2013-12-23: qty 2

## 2013-12-23 MED ORDER — ESCITALOPRAM OXALATE 10 MG PO TABS
10.0000 mg | ORAL_TABLET | Freq: Every day | ORAL | Status: DC
  Administered 2013-12-24: 10 mg via ORAL
  Filled 2013-12-23 (×3): qty 1

## 2013-12-23 MED ORDER — SODIUM CHLORIDE 0.9 % IV SOLN
1000.0000 mL | INTRAVENOUS | Status: DC
  Administered 2013-12-23 – 2013-12-24 (×2): 1000 mL via INTRAVENOUS

## 2013-12-23 MED ORDER — AMLODIPINE BESYLATE-VALSARTAN 10-160 MG PO TABS: 1.0000 | ORAL_TABLET | Freq: Every morning | ORAL | Status: DC

## 2013-12-23 MED ORDER — ONDANSETRON HCL 4 MG PO TABS: 4.0000 mg | ORAL_TABLET | Freq: Four times a day (QID) | ORAL | Status: DC | PRN

## 2013-12-23 MED ORDER — IRBESARTAN 150 MG PO TABS
150.0000 mg | ORAL_TABLET | Freq: Every day | ORAL | Status: DC
  Administered 2013-12-24: 150 mg via ORAL
  Filled 2013-12-23 (×3): qty 1

## 2013-12-23 MED ORDER — ONDANSETRON HCL 4 MG/2ML IJ SOLN
4.0000 mg | Freq: Once | INTRAMUSCULAR | Status: AC
  Administered 2013-12-23: 4 mg via INTRAVENOUS
  Filled 2013-12-23: qty 2

## 2013-12-23 MED ORDER — PANTOPRAZOLE SODIUM 40 MG IV SOLR
40.0000 mg | INTRAVENOUS | Status: DC
  Administered 2013-12-23: 40 mg via INTRAVENOUS
  Filled 2013-12-23: qty 40

## 2013-12-23 NOTE — Telephone Encounter (Signed)
HUSBAND CALLED TO SPEAK TO ME REGARDING WIFE WITH VOMITING AND BLACK TARRY STOOL.S. USED IBUPROFEN THUR BUT NOT REGULARLY. FEELS DIZZY. EXPLAINED TO PT SHE SHOULD GO TO NEAREST ED BUT WANTS TO COME TO [facility]. PT NOT HAVING BRBPR. NEEDS CBC IN ED AND LIKELY ADMISSION FOR GI BLEED/EGD. DISCUSSED WITH DR. Stevie Kern.

## 2013-12-23 NOTE — H&P (Signed)
[facility] History and Physical  ARNELLE NALE TGG:[id number] DOB: Aug 31, 1965 DOA: 12/23/2013  Referring physician: ER PCP: PROVIDER NOT IN SYSTEM   Chief Complaint: Black tarry stools.  HPI: Suzanne Holloway is a 48 y.o. female  This is a 48 year old lady who gives a two-day history of black tarry stools. She has been vomiting but no frank hematemesis or coffee-ground vomitus. She has not had any significant abdominal pain. She has been dizzy/lightheaded. She did take ibuprofen approximately 3-4 days ago but she does not take nonsteroidal anti-inflammatory medications on a regular basis. She has no history of alcohol abuse. She does have a history of gastroesophageal reflux disease for which she takes PPI. Evaluation in the emergency room showed that she was anemic. She is now being admitted for further management.   Review of Systems:  Constitutional:  No weight loss, night sweats, Fevers, chills, fatigue.  HEENT:  No headaches, Difficulty swallowing,Tooth/dental problems,Sore throat,  No sneezing, itching, ear ache, nasal congestion, post nasal drip,  Cardio-vascular:  No chest pain, Orthopnea, PND, swelling in lower extremities, anasarca, dizziness, palpitations    Resp:  No shortness of breath with exertion or at rest. No excess mucus, no productive cough, No non-productive cough, No coughing up of blood.No change in color of mucus.No wheezing.No chest wall deformity  Skin:  no rash or lesions.  GU:  no dysuria, change in color of urine, no urgency or frequency. No flank pain.  Musculoskeletal:  No joint pain or swelling. No decreased range of motion. No back pain.  Psych:  No change in mood or affect. No depression or anxiety. No memory loss.   Past Medical History  Diagnosis Date  . Hypertension   . Chronic kidney disease     kidney stone, 2 kidney stones 2004  . GERD (gastroesophageal reflux disease)   . Headache(784.0)     occ.   Past Surgical History    Procedure Laterality Date  . Abdominal hysterectomy    . Cesarean section      x 2  . Foot surgery  2011    Bilateral   Social History:  reports that she has never smoked. She has never used smokeless tobacco. She reports that she drinks alcohol. She reports that she does not use illicit drugs.  No Known Allergies  No family history on file.   Prior to Admission medications   Medication Sig Start Date End Date Taking? Authorizing Provider  amLODipine-valsartan (EXFORGE) 10-160 MG per tablet Take 1 tablet by mouth every morning.   Yes Historical Provider, MD  escitalopram (LEXAPRO) 10 MG tablet Take 10 mg by mouth daily.   Yes Historical Provider, MD  Multiple Vitamin (MULTIVITAMIN WITH MINERALS) TABS Take 1 tablet by mouth every morning.   Yes Historical Provider, MD  omeprazole (PRILOSEC) 40 MG capsule Take 40 mg by mouth every morning.   Yes Historical Provider, MD   Physical Exam: Filed Vitals:   12/23/13 1456 12/23/13 1814  BP: 113/73 110/71  Pulse: 94 62  Temp: 98.8 F (37.1 C)   TempSrc: Oral   Resp: 18 17  Height: 5\' 5"  (1.651 m)   Weight: 86.183 kg (190 lb)   SpO2: 100% 100%    Wt Readings from Last 3 Encounters:  12/23/13 86.183 kg (190 lb)  07/09/12 79.833 kg (176 lb)  07/09/12 79.833 kg (176 lb)    General:  Appears calm and comfortable. She does not look pale. Hemodynamically stable. Eyes: PERRL, normal lids, irises & conjunctiva  ENT: grossly normal hearing, lips & tongue Neck: no LAD, masses or thyromegaly Cardiovascular: RRR, no m/r/g. No LE edema. Telemetry: SR, no arrhythmias  Respiratory: CTA bilaterally, no w/r/r. Normal respiratory effort. Abdomen: soft, ntnd Skin: no rash or induration seen on limited exam Musculoskeletal: grossly normal tone BUE/BLE Psychiatric: grossly normal mood and affect, speech fluent and appropriate Neurologic: grossly non-focal.          Labs on Admission:  Basic Metabolic Panel:  Recent Labs Lab 12/23/13 1533   NA 141  K 3.4*  CL 104  CO2 27  GLUCOSE 85  BUN 20  CREATININE 0.68  CALCIUM 8.3*   Liver Function Tests:  Recent Labs Lab 12/23/13 1533  AST 16  ALT 12  ALKPHOS 66  BILITOT 0.5  PROT 6.2  ALBUMIN 3.3*    Recent Labs Lab 12/23/13 1533  LIPASE 32   No results found for this basename: AMMONIA,  in the last 168 hours CBC:  Recent Labs Lab 12/23/13 1533  WBC 6.6  NEUTROABS 3.9  HGB 9.0*  HCT 26.8*  MCV 96.8  PLT 172   Cardiac Enzymes: No results found for this basename: CKTOTAL, CKMB, CKMBINDEX, TROPONINI,  in the last 168 hours  BNP (last 3 results) No results found for this basename: PROBNP,  in the last 8760 hours CBG: No results found for this basename: GLUCAP,  in the last 168 hours  Radiological Exams on Admission: Dg Chest Portable 1 View  12/23/2013   CLINICAL DATA:  Dizziness.  Melena.  Vomiting.  EXAM: PORTABLE CHEST - 1 VIEW  COMPARISON:  06/18/2003  FINDINGS: The heart size and mediastinal contours are within normal limits. Both lungs are clear. The visualized skeletal structures are unremarkable.  IMPRESSION: No active disease.   Electronically Signed   By: Earle Gell M.D.   On: 12/23/2013 15:29      Assessment/Plan   1. GI bleed, likely a peptic ulcer. Hemoglobin 9.0. Hemodynamically stable. 2. Hypertension. 3. History of gastroesophageal reflux disease.  Plan: 1. Admit. 2. Intravenous Protonix. 3. Serial hemoglobins. 4. Gastroenterology consultation with a view to EGD tomorrow.  Further recommendations will depend on patient's hospital progress.   Code Status: Full code  DVT Prophylaxis: SCDs.  Family Communication: I discussed the plan with the patient and patient's husband at the bedside.   Disposition Plan: Home when medically stable.   Time spent: 60 minutes.  Doree Albee [facility] Pager [phone].  **Disclaimer: This note may have been dictated with voice recognition software. Similar sounding words  can inadvertently be transcribed and this note may contain transcription errors which may not have been corrected upon publication of note.**

## 2013-12-23 NOTE — ED Provider Notes (Signed)
CSN: [id number]     Arrival date & time 12/23/13  1449 History   First MD Initiated Contact with Patient 12/23/13 1505     Chief Complaint  Patient presents with  . Melena    Patient is a 48 y.o. female presenting with hematochezia. The history is provided by the patient.  Rectal Bleeding Quality:  Black and tarry Amount:  Moderate Duration:  2 days Timing:  Intermittent (5 episodes) Associated symptoms: abdominal pain (mild crampy), dizziness, light-headedness and vomiting   Associated symptoms: no fever, no hematemesis and no loss of consciousness   Vomiting:    Number of occurrences:  Two   Vomiting duration: yesterday. Risk factors: no anticoagulant use and no NSAID use   Risk factors comment:  Not currently taking naprosyn   Past Medical History  Diagnosis Date  . Hypertension   . Chronic kidney disease     kidney stone, 2 kidney stones 2004  . GERD (gastroesophageal reflux disease)   . Headache(784.0)     occ.   Past Surgical History  Procedure Laterality Date  . Abdominal hysterectomy    . Cesarean section      x 2  . Foot surgery  2011    Bilateral   No family history on file. History  Substance Use Topics  . Smoking status: Never Smoker   . Smokeless tobacco: Never Used  . Alcohol Use: Yes     Comment: occ   OB History   Grav Para Term Preterm Abortions TAB SAB Ect Mult Living                 Review of Systems  Constitutional: Positive for diaphoresis. Negative for fever.  Respiratory: Positive for shortness of breath (maybe a little).   Cardiovascular: Negative for chest pain.  Gastrointestinal: Positive for vomiting, abdominal pain (mild crampy) and hematochezia. Negative for hematemesis.  Neurological: Positive for dizziness and light-headedness. Negative for loss of consciousness.  All other systems reviewed and are negative.     Allergies  Review of patient's allergies indicates no known allergies.  Home Medications   Prior to  Admission medications   Medication Sig Start Date End Date Taking? Authorizing Provider  amLODipine-valsartan (EXFORGE) 10-160 MG per tablet Take 1 tablet by mouth every morning.   Yes Historical Provider, MD  escitalopram (LEXAPRO) 10 MG tablet Take 10 mg by mouth daily.   Yes Historical Provider, MD  Multiple Vitamin (MULTIVITAMIN WITH MINERALS) TABS Take 1 tablet by mouth every morning.   Yes Historical Provider, MD  omeprazole (PRILOSEC) 40 MG capsule Take 40 mg by mouth every morning.   Yes Historical Provider, MD   BP 113/73  Pulse 94  Temp(Src) 98.8 F (37.1 C) (Oral)  Resp 18  Ht 5\' 5"  (1.651 m)  Wt 190 lb (86.183 kg)  BMI 31.62 kg/m2  SpO2 100% Physical Exam  Nursing note and vitals reviewed. Constitutional: She appears well-developed and well-nourished. No distress.  HENT:  Head: Normocephalic and atraumatic.  Right Ear: External ear normal.  Left Ear: External ear normal.  Eyes: Conjunctivae are normal. Right eye exhibits no discharge. Left eye exhibits no discharge. No scleral icterus.  Neck: Neck supple. No tracheal deviation present.  Cardiovascular: Normal rate, regular rhythm and intact distal pulses.   Pulmonary/Chest: Effort normal and breath sounds normal. No stridor. No respiratory distress. She has no wheezes. She has no rales.  Abdominal: Soft. Bowel sounds are normal. She exhibits no distension. There is no tenderness. There is  no rebound and no guarding.  Genitourinary: Rectal exam shows no mass and no tenderness.  Dark stool on rectal exam  Musculoskeletal: She exhibits no edema and no tenderness.  Neurological: She is alert. She has normal strength. No cranial nerve deficit (no facial droop, extraocular movements intact, no slurred speech) or sensory deficit. She exhibits normal muscle tone. She displays no seizure activity. Coordination normal.  Skin: Skin is warm and dry. No rash noted. No pallor.  Psychiatric: She has a normal mood and affect.    ED  Course  Procedures (including critical care time) Labs Review Labs Reviewed  CBC WITH DIFFERENTIAL - Abnormal; Notable for the following:    RBC 2.77 (*)    Hemoglobin 9.0 (*)    HCT 26.8 (*)    All other components within normal limits  COMPREHENSIVE METABOLIC PANEL - Abnormal; Notable for the following:    Potassium 3.4 (*)    Calcium 8.3 (*)    Albumin 3.3 (*)    All other components within normal limits  LIPASE, BLOOD  APTT  PROTIME-INR  I-STAT TROPOININ, ED  POC OCCULT BLOOD, ED  TYPE AND SCREEN    Imaging Review Dg Chest Portable 1 View  12/23/2013   CLINICAL DATA:  Dizziness.  Melena.  Vomiting.  EXAM: PORTABLE CHEST - 1 VIEW  COMPARISON:  06/18/2003  FINDINGS: The heart size and mediastinal contours are within normal limits. Both lungs are clear. The visualized skeletal structures are unremarkable.  IMPRESSION: No active disease.   Electronically Signed   By: Earle Gell M.D.   On: 12/23/2013 15:29     EKG Interpretation   Date/Time:  Monday December 23 2013 16:42:21 EDT Ventricular Rate:  87 PR Interval:  151 QRS Duration: 82 QT Interval:  384 QTC Calculation: 462 R Axis:   -21 Text Interpretation:  Sinus rhythm Borderline left axis deviation  Borderline T wave abnormalities No significant change since last tracing  Confirmed by Shernita Rabinovich  MD-J, Venera Privott ([id number]) on 12/23/2013 4:46:51 PM     Medications  0.9 %  sodium chloride infusion (1,000 mLs Intravenous New Bag/Given 12/23/13 1625)  pantoprazole (PROTONIX) injection 40 mg (40 mg Intravenous Given 12/23/13 1625)  ondansetron (ZOFRAN) injection 4 mg (4 mg Intravenous Given 12/23/13 1625)    MDM   Final diagnoses:  Upper GI bleed    Patient's hemoglobin has decreased from previous values.  She does have guaiac positive stools. Her symptoms are suggestive of an upper GI bleed. Patient is hemodynamically stable and does not require blood transfusion at this time.    Dorie Rank, MD 12/23/13 [phone]

## 2013-12-23 NOTE — Consult Note (Signed)
Referring Provider: No ref. provider found Primary Care Physician:  PROVIDER NOT IN SYSTEM Primary Gastroenterologist:  Barney Drain  Reason for Consultation:  MELENA   Impression: ADMITTED WITH MELENA-ETIOLOGY UNKNOWN, MOST LIKELY DUE TO PUD, LESS LIKELY DIEULAFOY'S LESION, OR AVMs, LESS LIKELY GIST OR CARCINOID TUMOR.  Plan: 1. FULL LIQUID DIET THEN CLEARS AROUND 5 AM. NPO AFTER 9 AM. PLAN EGD AFTERNOON. 2. BID PPI 3. SUPPORTIVE CARE   HPI:  LASTCBC IN [facility] 2014: HB 13.5. HAD CONSTIPATION AND TOOK A CORRECTOL. THEN HAD ACUTE ONSET OF NAUSEA/VOMITING AND BLACK TARRY STOOLS. TOOK ONE  IBUPROFEN THUR. DOESN'T USE NSAIDs REGULARLY. VOMITED YESTERDAY AND LAST BLACK STOOL THIS AM. MILD LUQ ABD PAIN. C/O FEELING DIZZY.  PT DENIES FEVER, CHILLS, HEMATOCHEZIA, HEMATEMESIS, diarrhea, CHEST PAIN, SHORTNESS OF BREATH,  problems swallowing, problems with sedation, heartburn or indigestion. NO EGD/TCS EVER.  Past Medical History  Diagnosis Date  . Hypertension   . Chronic kidney disease     kidney stone, 2 kidney stones 2004  . GERD (gastroesophageal reflux disease)   . Headache(784.0)     occ.   Past Surgical History  Procedure Laterality Date  . Abdominal hysterectomy    . Cesarean section      x 2  . Foot surgery  2011    Bilateral    Prior to Admission medications   Medication Sig Start Date End Date Taking? Authorizing Provider  amLODipine-valsartan (EXFORGE) 10-160 MG per tablet Take 1 tablet by mouth every morning.   Yes Historical Provider, MD  escitalopram (LEXAPRO) 10 MG tablet Take 10 mg by mouth daily.   Yes Historical Provider, MD  Multiple Vitamin (MULTIVITAMIN WITH MINERALS) TABS Take 1 tablet by mouth every morning.   Yes Historical Provider, MD  omeprazole (PRILOSEC) 40 MG capsule Take 40 mg by mouth every morning.   Yes Historical Provider, MD    Current Facility-Administered Medications  Medication Dose Route Frequency Provider Last Rate Last Dose  . 0.9 %   sodium chloride infusion  1,000 mL Intravenous Continuous Dorie Rank, MD 125 mL/hr at 12/23/13 1625 1,000 mL at 12/23/13 1625  . [START ON 12/24/2013] amLODipine (NORVASC) tablet 10 mg  10 mg Oral Daily Nimish Luther Parody, MD       And  . Derrill Memo ON 12/24/2013] irbesartan (AVAPRO) tablet 150 mg  150 mg Oral Daily Nimish C Anastasio Champion, MD      . Derrill Memo ON 12/24/2013] escitalopram (LEXAPRO) tablet 10 mg  10 mg Oral Daily Nimish C Gosrani, MD      . ondansetron (ZOFRAN) tablet 4 mg  4 mg Oral Q6H PRN Nimish Luther Parody, MD       Or  . ondansetron (ZOFRAN) injection 4 mg  4 mg Intravenous Q6H PRN Nimish C Gosrani, MD      . sodium chloride 0.9 % injection 3 mL  3 mL Intravenous Q12H Doree Albee, MD        Allergies as of 12/23/2013  . (No Known Allergies)    FAMILY HISTORY: NO COLON CA OR POLYPS   History   Social History  . Marital Status: Married    Spouse Name: N/A    Number of Children: N/A  . Years of Education: N/A   Occupational History  . Not on file.   Social History Main Topics  . Smoking status: Never Smoker   . Smokeless tobacco: Never Used  . Alcohol Use: Yes     Comment: occ  . Drug Use: No  .  Sexual Activity: Not on file   Review of Systems: PER HPI OTHERWISE ALL SYSTEMS ARE NEGATIVE.   Vitals: Blood pressure 104/66, pulse 71, temperature 99 F (37.2 C), temperature source Oral, resp. rate 20, height 5\' 5"  (1.651 m), weight 190 lb (86.183 kg), SpO2 93.00%.  Physical Exam: General:   Alert,  Well-developed, well-nourished, pleasant and cooperative in NAD Head:  Normocephalic and atraumatic. Eyes:  Sclera clear, no icterus.   Conjunctiva pink. Mouth:  No lesions, dentition normal. Neck:  Supple; no masses. Lungs:  Clear throughout to auscultation.   No wheezes. No acute distress. Heart:  Regular rate and rhythm; no murmurs, clicks, rubs,  or gallops. Abdomen:  Soft, MILD LUQ TTP, nondistended. No  noted. Normal bowel sounds, without guarding, and without rebound.    Msk:  Symmetrical without gross deformities. Normal posture. Extremities:  Without edema. Neurologic:  Alert and  oriented x4;  grossly normal neurologically. Cervical Nodes:  No significant cervical adenopathy. Psych:  Alert and cooperative. Normal mood and affect.   Lab Results:  Recent Labs  12/23/13 1533  WBC 6.6  HGB 9.0*  HCT 26.8*  PLT 172   BMET  Recent Labs  12/23/13 1533  NA 141  K 3.4*  CL 104  CO2 27  GLUCOSE 85  BUN 20  CREATININE 0.68  CALCIUM 8.3*   LFT  Recent Labs  12/23/13 1533  PROT 6.2  ALBUMIN 3.3*  AST 16  ALT 12  ALKPHOS 66  BILITOT 0.5     Studies/Results: CXR: NACPD   LOS: 0 days   Maddox Bratcher  12/23/2013, 8:32 PM

## 2013-12-23 NOTE — ED Notes (Signed)
Pt reports black tarry stools for past 2 days.  Reports took a laxative Sunday.  Reports generalized weakness and dizziness.  Reports n/v since yesterday but denies any bright red blood or coffee ground emesis.

## 2013-12-24 ENCOUNTER — Encounter (HOSPITAL_COMMUNITY): Payer: Self-pay | Admitting: *Deleted

## 2013-12-24 ENCOUNTER — Encounter (HOSPITAL_COMMUNITY)
Admission: EM | Disposition: A | Discharge status: Home / Self Care | Payer: Self-pay | Source: Home / Self Care | Attending: Internal Medicine

## 2013-12-24 DIAGNOSIS — K2960 Other gastritis without bleeding: Secondary | ICD-10-CM

## 2013-12-24 DIAGNOSIS — K269 Duodenal ulcer, unspecified as acute or chronic, without hemorrhage or perforation: Secondary | ICD-10-CM

## 2013-12-24 HISTORY — PX: ESOPHAGOGASTRODUODENOSCOPY: SHX5428

## 2013-12-24 LAB — COMPREHENSIVE METABOLIC PANEL
ALT: 11 U/L (ref 0–35)
AST: 16 U/L (ref 0–37)
Albumin: 3 g/dL — ABNORMAL LOW (ref 3.5–5.2)
Alkaline Phosphatase: 66 U/L (ref 39–117)
Anion gap: 6 (ref 5–15)
BILIRUBIN TOTAL: 0.9 mg/dL (ref 0.3–1.2)
BUN: 8 mg/dL (ref 6–23)
CALCIUM: 7.9 mg/dL — AB (ref 8.4–10.5)
CHLORIDE: 107 meq/L (ref 96–112)
CO2: 27 mEq/L (ref 19–32)
Creatinine, Ser: 0.62 mg/dL (ref 0.50–1.10)
GFR calc Af Amer: >90 mL/min (ref >90)
GFR calc non Af Amer: >90 mL/min (ref >90)
Glucose, Bld: 86 mg/dL (ref 70–99)
Potassium: 3.9 mEq/L (ref 3.7–5.3)
Sodium: 140 mEq/L (ref 137–147)
TOTAL PROTEIN: 5.6 g/dL — AB (ref 6.0–8.3)

## 2013-12-24 LAB — CBC
HEMATOCRIT: 25.1 % — AB (ref 36.0–46.0)
HEMOGLOBIN: 8.4 g/dL — AB (ref 12.0–15.0)
MCH: 32.6 pg (ref 26.0–34.0)
MCHC: 33.5 g/dL (ref 30.0–36.0)
MCV: 97.3 fL (ref 78.0–100.0)
Platelets: 144 10*3/uL — ABNORMAL LOW (ref 150–400)
RBC: 2.58 MIL/uL — AB (ref 3.87–5.11)
RDW: 12.5 % (ref 11.5–15.5)
WBC: 5.5 10*3/uL (ref 4.0–10.5)

## 2013-12-24 SURGERY — EGD (ESOPHAGOGASTRODUODENOSCOPY)
Anesthesia: Moderate Sedation

## 2013-12-24 MED ORDER — PANTOPRAZOLE SODIUM 40 MG PO TBEC
40.0000 mg | DELAYED_RELEASE_TABLET | Freq: Two times a day (BID) | ORAL | Status: DC
  Administered 2013-12-24: 40 mg via ORAL
  Filled 2013-12-24: qty 1

## 2013-12-24 MED ORDER — MEPERIDINE HCL 100 MG/ML IJ SOLN
INTRAMUSCULAR | Status: AC
Start: 2013-12-24 — End: 2013-12-24
  Filled 2013-12-24: qty 2

## 2013-12-24 MED ORDER — OMEPRAZOLE 40 MG PO CPDR: 40.0000 mg | DELAYED_RELEASE_CAPSULE | Freq: Two times a day (BID) | ORAL | Status: AC

## 2013-12-24 MED ORDER — SODIUM CHLORIDE 0.9 % IV SOLN: INTRAVENOUS | Status: DC

## 2013-12-24 MED ORDER — MIDAZOLAM HCL 5 MG/5ML IJ SOLN
INTRAMUSCULAR | Status: DC | PRN
  Administered 2013-12-24 (×2): 1 mg via INTRAVENOUS
  Administered 2013-12-24 (×2): 2 mg via INTRAVENOUS

## 2013-12-24 MED ORDER — MIDAZOLAM HCL 5 MG/5ML IJ SOLN
INTRAMUSCULAR | Status: AC
  Filled 2013-12-24: qty 10

## 2013-12-24 MED ORDER — LIDOCAINE VISCOUS 2 % MT SOLN
OROMUCOSAL | Status: AC
  Filled 2013-12-24: qty 15

## 2013-12-24 MED ORDER — STERILE WATER FOR IRRIGATION IR SOLN
Status: DC | PRN
  Administered 2013-12-24: 14:00:00

## 2013-12-24 MED ORDER — MEPERIDINE HCL 100 MG/ML IJ SOLN
INTRAMUSCULAR | Status: DC | PRN
  Administered 2013-12-24 (×3): 25 mg via INTRAVENOUS

## 2013-12-24 NOTE — Discharge Summary (Signed)
Physician Discharge Summary  Suzanne Holloway JJO:[id number] DOB: 1965/08/05 DOA: 12/23/2013  PCP: Lonzo Cloud, MD  Admit date: 12/23/2013 Discharge date: 12/24/2013  Time spent: 30 minutes  Recommendations for Outpatient Follow-up:  Follow up with PCP in 1 week. Check H&H.  Follow up with GI in 3 weeks. Follow duodenal bx    Discharge Diagnoses:  Principal Problem:   Duodenal ulcer without hemorrhage or perforation   Active Problems:   Upper GI bleeding   HTN (hypertension)   GERD (gastroesophageal reflux disease)   Erosive gastritis   Discharge Condition: fair  Diet recommendation: regular  Filed Weights   12/23/13 1456  Weight: 86.183 kg (190 lb)    History of present illness:  48 year old female with history of hypertension, GERD who presented with 2 day history of black out he stool. She also had episodes of vomiting which was non bloody. She reports taking an occasional ibuprofen, denies smoking occasional alcohol use. Patient found to be anemic with hemoglobin of 9 in the ED and admitted to hospitalist service.  Hospital Course:  Upper GI bleeding -patient placed on IV PPI -EGD done by GI showing erosive gastritis and multiple nonbleeding duodenal ulcers. Biopsy sent. Continue twice a day PPI for 3 months and follow up with GI. Follow biopsy results. instructed to avoid NSAIDs -Monitor H&H as outpatient. -patient stable for d/c home.  HTN  resume home meds  Anemia Monitor H&H in 1 week   Code Status: Full code Family Communication: Husband and mother at bedside Disposition Plan: Home with outpatient followup   Procedures:  EGD with biopsy  Consultations:  Dr Barney Drain  Discharge Exam: Filed Vitals:   12/24/13 1501  BP: 103/61  Pulse: 69  Temp: 98.3 F (36.8 C)  Resp: 20    General: middle aged female in NAD HEENT: no pallor, moist mucosa Chest: clear b/l, no added sounds CVS: NS 1&s2, No murmurs Abd: soft, NT, ND, bs+ EXT: warm, no  edema   Discharge Instructions You were cared for by a hospitalist during your hospital stay. If you have any questions about your discharge medications or the care you received while you were in the hospital after you are discharged, you can call the unit and asked to speak with the hospitalist on call if the hospitalist that took care of you is not available. Once you are discharged, your primary care physician will handle any further medical issues. Please note that NO REFILLS for any discharge medications will be authorized once you are discharged, as it is imperative that you return to your primary care physician (or establish a relationship with a primary care physician if you do not have one) for your aftercare needs so that they can reassess your need for medications and monitor your lab values.     Medication List         escitalopram 10 MG tablet  Commonly known as:  LEXAPRO  Take 10 mg by mouth daily.     EXFORGE 10-160 MG per tablet  Generic drug:  amLODipine-valsartan  Take 1 tablet by mouth every morning.     multivitamin with minerals Tabs tablet  Take 1 tablet by mouth every morning.     omeprazole 40 MG capsule  Commonly known as:  PRILOSEC  Take 1 capsule (40 mg total) by mouth 2 (two) times daily.       No Known Allergies     Follow-up Information   Follow up with Lonzo Cloud, MD. Schedule an appointment as  soon as possible for a visit in 1 week.   Specialty:  General Practice   Contact information:   [place] [address] [phone]       Call Barney Drain, MD.   Specialty:  Gastroenterology   Contact information:   [place] [address] [place] [address] [phone]        The results of significant diagnostics from this hospitalization (including imaging, microbiology, ancillary and laboratory) are listed below for reference.    Significant Diagnostic Studies: Dg Chest Portable 1  View  12/23/2013   CLINICAL DATA:  Dizziness.  Melena.  Vomiting.  EXAM: PORTABLE CHEST - 1 VIEW  COMPARISON:  06/18/2003  FINDINGS: The heart size and mediastinal contours are within normal limits. Both lungs are clear. The visualized skeletal structures are unremarkable.  IMPRESSION: No active disease.   Electronically Signed   By: Earle Gell M.D.   On: 12/23/2013 15:29    Microbiology: No results found for this or any previous visit (from the past 240 hour(s)).   Labs: Basic Metabolic Panel:  Recent Labs Lab 12/23/13 1533 12/24/13 0623  NA 141 140  K 3.4* 3.9  CL 104 107  CO2 27 27  GLUCOSE 85 86  BUN 20 8  CREATININE 0.68 0.62  CALCIUM 8.3* 7.9*   Liver Function Tests:  Recent Labs Lab 12/23/13 1533 12/24/13 0623  AST 16 16  ALT 12 11  ALKPHOS 66 66  BILITOT 0.5 0.9  PROT 6.2 5.6*  ALBUMIN 3.3* 3.0*    Recent Labs Lab 12/23/13 1533  LIPASE 32   No results found for this basename: AMMONIA,  in the last 168 hours CBC:  Recent Labs Lab 12/23/13 1533 12/24/13 0623  WBC 6.6 5.5  NEUTROABS 3.9  --   HGB 9.0* 8.4*  HCT 26.8* 25.1*  MCV 96.8 97.3  PLT 172 144*   Cardiac Enzymes: No results found for this basename: CKTOTAL, CKMB, CKMBINDEX, TROPONINI,  in the last 168 hours BNP: BNP (last 3 results) No results found for this basename: PROBNP,  in the last 8760 hours CBG: No results found for this basename: GLUCAP,  in the last 168 hours     Signed:  Yaasir Menken  [facility] 12/24/2013, 3:26 PM

## 2013-12-24 NOTE — Discharge Instructions (Signed)

## 2013-12-24 NOTE — H&P (View-Only) (Signed)
Referring Provider: No ref. provider found Primary Care Physician:  PROVIDER NOT IN SYSTEM Primary Gastroenterologist:  Barney Drain  Reason for Consultation:  MELENA   Impression: ADMITTED WITH MELENA-ETIOLOGY UNKNOWN, MOST LIKELY DUE TO PUD, LESS LIKELY DIEULAFOY'S LESION, OR AVMs, LESS LIKELY GIST OR CARCINOID TUMOR.  Plan: 1. FULL LIQUID DIET THEN CLEARS AROUND 5 AM. NPO AFTER 9 AM. PLAN EGD AFTERNOON. 2. BID PPI 3. SUPPORTIVE CARE   HPI:  LASTCBC IN [facility] 2014: HB 13.5. HAD CONSTIPATION AND TOOK A CORRECTOL. THEN HAD ACUTE ONSET OF NAUSEA/VOMITING AND BLACK TARRY STOOLS. TOOK ONE  IBUPROFEN THUR. DOESN'T USE NSAIDs REGULARLY. VOMITED YESTERDAY AND LAST BLACK STOOL THIS AM. MILD LUQ ABD PAIN. C/O FEELING DIZZY.  PT DENIES FEVER, CHILLS, HEMATOCHEZIA, HEMATEMESIS, diarrhea, CHEST PAIN, SHORTNESS OF BREATH,  problems swallowing, problems with sedation, heartburn or indigestion. NO EGD/TCS EVER.  Past Medical History  Diagnosis Date  . Hypertension   . Chronic kidney disease     kidney stone, 2 kidney stones 2004  . GERD (gastroesophageal reflux disease)   . Headache(784.0)     occ.   Past Surgical History  Procedure Laterality Date  . Abdominal hysterectomy    . Cesarean section      x 2  . Foot surgery  2011    Bilateral    Prior to Admission medications   Medication Sig Start Date End Date Taking? Authorizing Provider  amLODipine-valsartan (EXFORGE) 10-160 MG per tablet Take 1 tablet by mouth every morning.   Yes Historical Provider, MD  escitalopram (LEXAPRO) 10 MG tablet Take 10 mg by mouth daily.   Yes Historical Provider, MD  Multiple Vitamin (MULTIVITAMIN WITH MINERALS) TABS Take 1 tablet by mouth every morning.   Yes Historical Provider, MD  omeprazole (PRILOSEC) 40 MG capsule Take 40 mg by mouth every morning.   Yes Historical Provider, MD    Current Facility-Administered Medications  Medication Dose Route Frequency Provider Last Rate Last Dose  . 0.9 %   sodium chloride infusion  1,000 mL Intravenous Continuous Dorie Rank, MD 125 mL/hr at 12/23/13 1625 1,000 mL at 12/23/13 1625  . [START ON 12/24/2013] amLODipine (NORVASC) tablet 10 mg  10 mg Oral Daily Nimish Luther Parody, MD       And  . Derrill Memo ON 12/24/2013] irbesartan (AVAPRO) tablet 150 mg  150 mg Oral Daily Nimish C Anastasio Champion, MD      . Derrill Memo ON 12/24/2013] escitalopram (LEXAPRO) tablet 10 mg  10 mg Oral Daily Nimish C Gosrani, MD      . ondansetron (ZOFRAN) tablet 4 mg  4 mg Oral Q6H PRN Nimish Luther Parody, MD       Or  . ondansetron (ZOFRAN) injection 4 mg  4 mg Intravenous Q6H PRN Nimish C Gosrani, MD      . sodium chloride 0.9 % injection 3 mL  3 mL Intravenous Q12H Doree Albee, MD        Allergies as of 12/23/2013  . (No Known Allergies)    FAMILY HISTORY: NO COLON CA OR POLYPS   History   Social History  . Marital Status: Married    Spouse Name: N/A    Number of Children: N/A  . Years of Education: N/A   Occupational History  . Not on file.   Social History Main Topics  . Smoking status: Never Smoker   . Smokeless tobacco: Never Used  . Alcohol Use: Yes     Comment: occ  . Drug Use: No  .  Sexual Activity: Not on file   Review of Systems: PER HPI OTHERWISE ALL SYSTEMS ARE NEGATIVE.   Vitals: Blood pressure 104/66, pulse 71, temperature 99 F (37.2 C), temperature source Oral, resp. rate 20, height 5\' 5"  (1.651 m), weight 190 lb (86.183 kg), SpO2 93.00%.  Physical Exam: General:   Alert,  Well-developed, well-nourished, pleasant and cooperative in NAD Head:  Normocephalic and atraumatic. Eyes:  Sclera clear, no icterus.   Conjunctiva pink. Mouth:  No lesions, dentition normal. Neck:  Supple; no masses. Lungs:  Clear throughout to auscultation.   No wheezes. No acute distress. Heart:  Regular rate and rhythm; no murmurs, clicks, rubs,  or gallops. Abdomen:  Soft, MILD LUQ TTP, nondistended. No  noted. Normal bowel sounds, without guarding, and without rebound.    Msk:  Symmetrical without gross deformities. Normal posture. Extremities:  Without edema. Neurologic:  Alert and  oriented x4;  grossly normal neurologically. Cervical Nodes:  No significant cervical adenopathy. Psych:  Alert and cooperative. Normal mood and affect.   Lab Results:  Recent Labs  12/23/13 1533  WBC 6.6  HGB 9.0*  HCT 26.8*  PLT 172   BMET  Recent Labs  12/23/13 1533  NA 141  K 3.4*  CL 104  CO2 27  GLUCOSE 85  BUN 20  CREATININE 0.68  CALCIUM 8.3*   LFT  Recent Labs  12/23/13 1533  PROT 6.2  ALBUMIN 3.3*  AST 16  ALT 12  ALKPHOS 66  BILITOT 0.5     Studies/Results: CXR: NACPD   LOS: 0 days   Aylah Yeary  12/23/2013, 8:32 PM

## 2013-12-24 NOTE — Interval H&P Note (Signed)
History and Physical Interval Note:  12/24/2013 1:27 PM  Suzanne Holloway  has presented today for surgery, with the diagnosis of MELENA  The various methods of treatment have been discussed with the patient and family. After consideration of risks, benefits and other options for treatment, the patient has consented to  Procedure(s): ESOPHAGOGASTRODUODENOSCOPY (EGD) (N/A) as a surgical intervention .  The patient's history has been reviewed, patient examined, no change in status, stable for surgery.  I have reviewed the patient's chart and labs.  Questions were answered to the patient's satisfaction.     [employer]

## 2013-12-24 NOTE — Care Management Note (Signed)
    Page 1 of 1   12/24/2013     4:10:17 PM CARE MANAGEMENT NOTE 12/24/2013  Patient:  Suzanne Holloway, Suzanne Holloway   Account Number:  [id number]  Date Initiated:  12/24/2013  Documentation initiated by:  CHILDRESS,JESSICA  Subjective/Objective Assessment:   Pt is from home with self care.     Action/Plan:   Pt plans to discharge home with self care today. No CM needs at this time.   Anticipated DC Date:  12/24/2013   Anticipated DC Plan:  [place]  CM consult      Choice offered to / List presented to:             Status of service:  Completed, signed off Medicare Important Message given?   (If response is "NO", the following Medicare IM given date fields will be blank) Date Medicare IM given:   Medicare IM given by:   Date Additional Medicare IM given:   Additional Medicare IM given by:    Discharge Disposition:  HOME/SELF CARE  Per UR Regulation:    If discussed at Long Length of Stay Meetings, dates discussed:    Comments:  12/24/2013 [place], RN, MSN, [facility]

## 2013-12-24 NOTE — Progress Notes (Signed)
Pt discharged home via wheelchair.  Pt IV removed without complications.  Pt instructed about follow up and medications were discussed.

## 2013-12-24 NOTE — Op Note (Signed)
[facility] [address] [place], [address]   ENDOSCOPY PROCEDURE REPORT  PATIENT: Suzanne Holloway, Suzanne Holloway  MR#: [id number] BIRTHDATE: 03-Feb-1966 , 47  yrs. old GENDER: Female  ENDOSCOPIST: Barney Drain, MD REFERRED BY:  PROCEDURE DATE: 12/24/2013 PROCEDURE:   EGD w/ biopsy  INDICATIONS:Melena. LAST MELENA/BM AUG 31 IN THE AM. HB MAR 2014 13.3 AND DROPPED TO 8.27 Dec 2013. MEDICATIONS: Demerol 75 mg IV and Versed 6 mg IV TOPICAL ANESTHETIC:   Viscous Xylocaine  DESCRIPTION OF PROCEDURE:     Physical exam was performed.  Informed consent was obtained from the patient after explaining the benefits, risks, and alternatives to the procedure.  The patient was connected to the monitor and placed in the left lateral position.  Continuous oxygen was provided by nasal cannula and IV medicine administered through an indwelling cannula.  After administration of sedation, the patients esophagus was intubated and the EC-3890Li ([id number]) and EG-[id number] ([id number])  endoscope was advanced under direct visualization to the second portion of the duodenum.  The scope was removed slowly by carefully examining the color, texture, anatomy, and integrity of the mucosa on the way out.  The patient was recovered in endoscopy and discharged home in satisfactory condition.   ESOPHAGUS: A Schatzki ring was found at the gastroesophageal junction.   STOMACH: Moderate erosive gastritis (inflammation) was found in the gastric antrum.  Multiple biopsies were performed using cold forceps.   DUODENUM: Multiple non-bleeding irregular shaped, punctate and clean-based ulcers, ranging between 3-37mm in size, with surrounding edema were found in the duodenal bulb.   The duodenal mucosa showed no abnormalities in the 2nd part of the duodenum.     NO OLD BLOOD OR FRESH BLOOD IN STOMACH/DUODENUM. COMPLICATIONS:   None  ENDOSCOPIC IMPRESSION: 1.   Schatzki ring at the gastroesophageal junction 2.   MODERATE  Erosive gastritis 3.   GI BLEED DUE TO Multiple non-bleeding ulcers  RECOMMENDATIONS: BID PPI AWAIT BIOPSY AVOID NSAIDS LOW FAT DIET OPV IN 3 MOS   REPEAT EXAM:   _______________________________ Lorrin MaisBarney Drain, MD 12/24/2013 3:02 PM       PATIENT NAME:  Suzanne Holloway MR#: [id number]

## 2013-12-24 NOTE — Care Management Utilization Note (Signed)
UR completed 

## 2013-12-27 ENCOUNTER — Encounter (HOSPITAL_COMMUNITY): Payer: Self-pay | Admitting: Gastroenterology

## 2014-01-01 NOTE — Telephone Encounter (Signed)
Faxed work excuse AUG 31 TO SEP 8 to [phone]

## 2014-01-02 ENCOUNTER — Telehealth: Payer: Self-pay | Admitting: Gastroenterology

## 2014-01-02 NOTE — Telephone Encounter (Signed)
PATIENT CALLED STATING THAT SHE HAD EGD PERFORMED SEPT 1 AND SHE WAS TOLD TO CALL AND FOLLOW UP WITH DR. FIELDS IN 3 WEEKS.  PLEASE ADVISE WHEN YOU WOULD LIKE THE PATIENT TO FOLLOW UP.  PATIENT'S NUMBER IS [phone]

## 2014-01-02 NOTE — Telephone Encounter (Signed)
PLEASE CALL PT. SHE NEEDS AN APP TIN 3 MOS NOT 3 WEEKS. OPV E30 DEC 2015 PUD/ANEMIA.

## 2014-01-02 NOTE — Telephone Encounter (Signed)
PATIENT NIC'D  °

## 2014-01-23 NOTE — Telephone Encounter (Addendum)
Called patient TO DISCUSS RESULTS. LVM- HER stomach Bx shows mild gastritis. HER DUODENAL ULCERS ARE DUE TO NSAID USE. PT .ASKED TO [facility] [phone] TO DISCUSS QUESTIONS OR CONCERNS.

## 2014-02-25 IMAGING — CT CT ABD-PELV W/ CM
2 of 4 series · 15 of 46 positions shown, 17 images · IV contrast (Omnipaque 300)
Comparison: None.

CLINICAL DATA: Flank pain, emesis

CT ABDOMEN AND PELVIS WITH CONTRAST
TECHNIQUE: Multidetector CT imaging of the abdomen and pelvis was
performed following the standard protocol during bolus
administration of intravenous contrast.
Contrast: 50mL OMNIPAQUE IOHEXOL 300 MG/ML  SOLN, 100mL OMNIPAQUE
IOHEXOL 300 MG/ML  SOLN

[Series 2: abd_pel_with 5.0 b40f · axial · 0.64mm/px · z∈[-706,-296]mm · 12 of 92 slices shown, 14 images]
[im 5/92  soft-tissue]
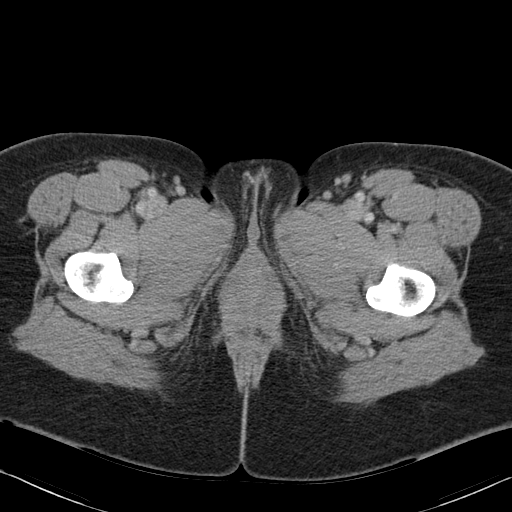
[im 5/92  bone]
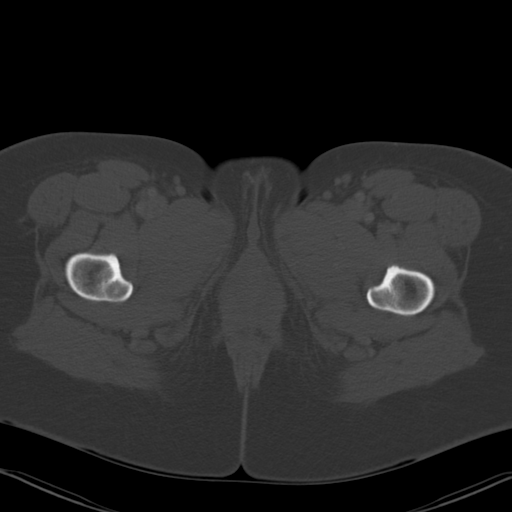
[im 14/92  soft-tissue]
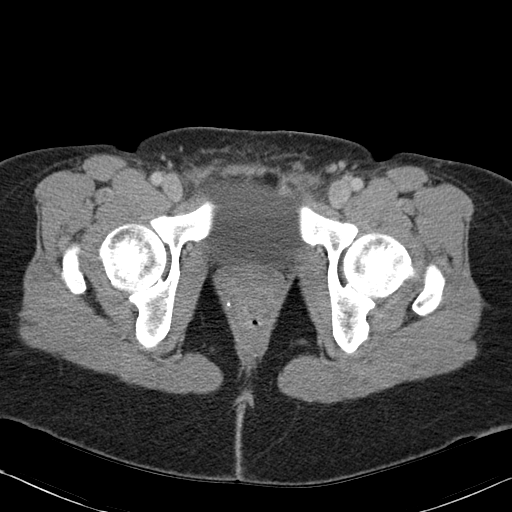
[im 22/92  soft-tissue]
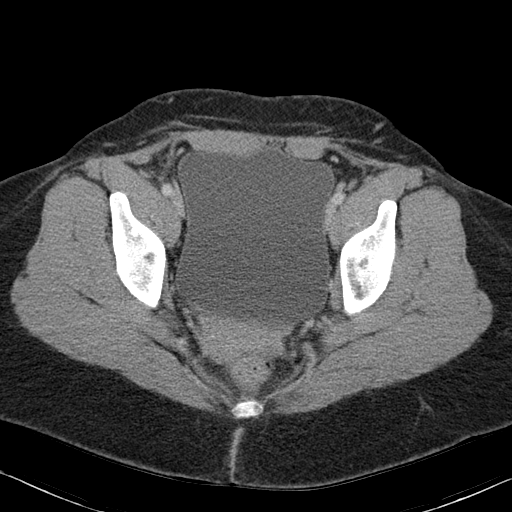
[im 27/92  soft-tissue]
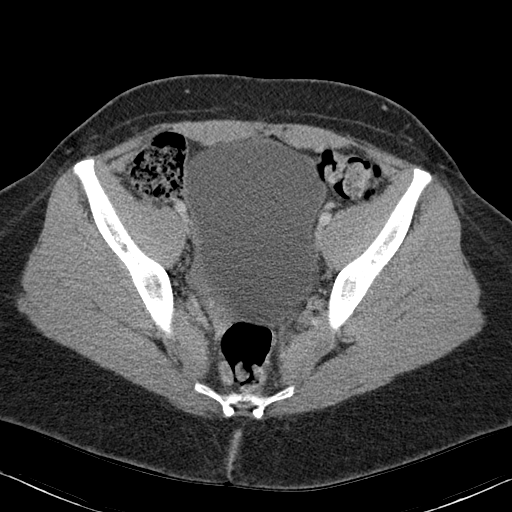
[im 35/92  soft-tissue]
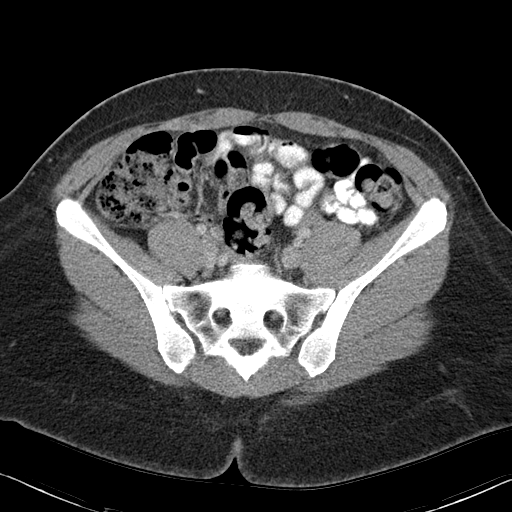
[im 44/92  soft-tissue]
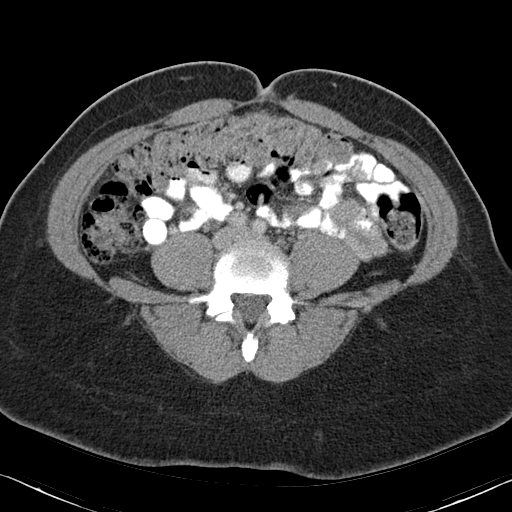
[im 48/92  soft-tissue]
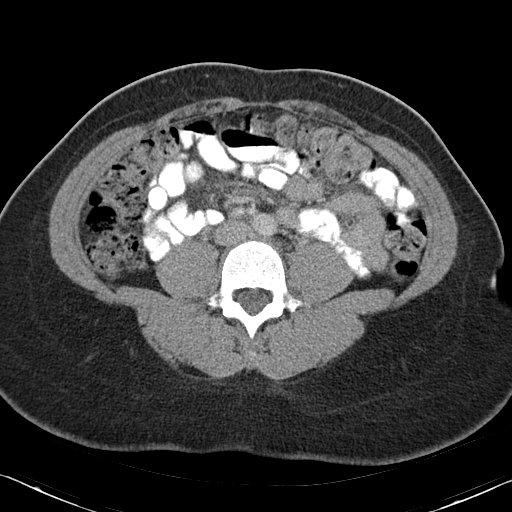
[im 57/92  soft-tissue]
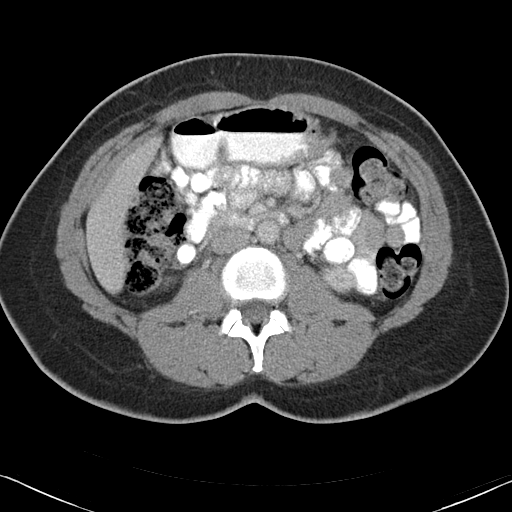
[im 66/92  soft-tissue]
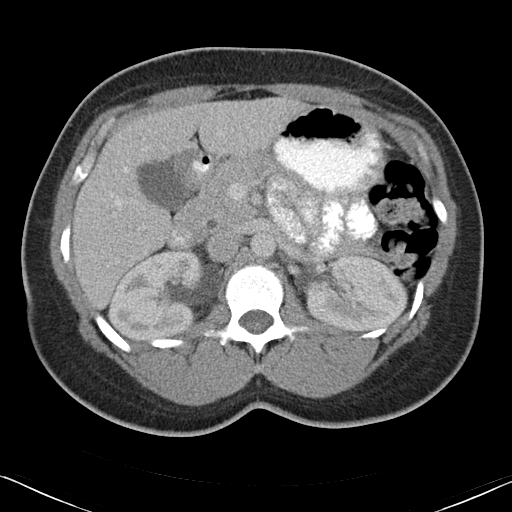
[im 66/92  bone]
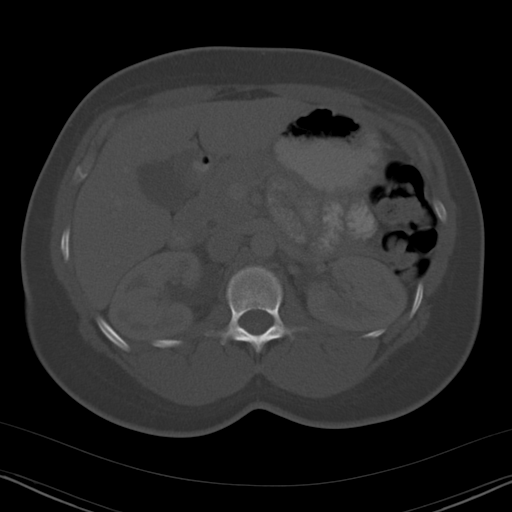
[im 70/92  soft-tissue]
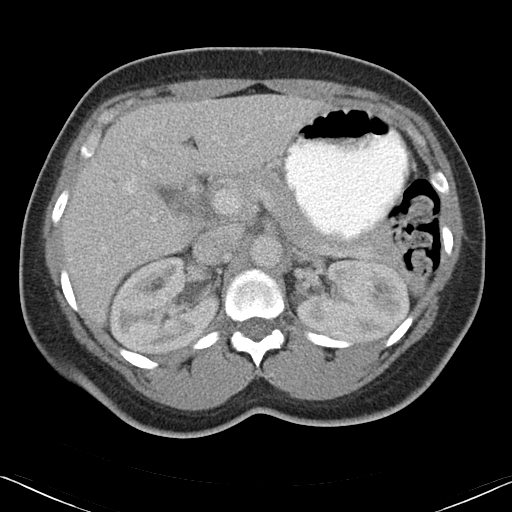
[im 79/92  soft-tissue]
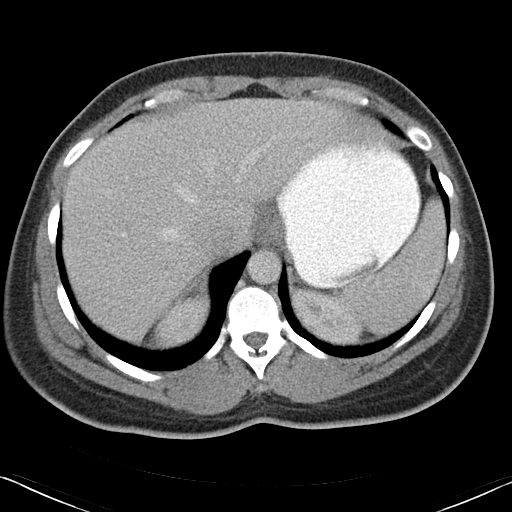
[im 87/92  soft-tissue]
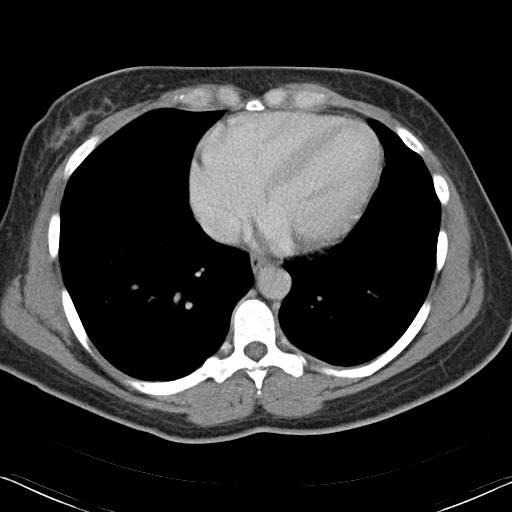

[Series 4: abd_pel_with 3.0 spo cor · coronal · 0.61mm/px · 3 of 94 slices shown]
[im 32/94  soft-tissue]
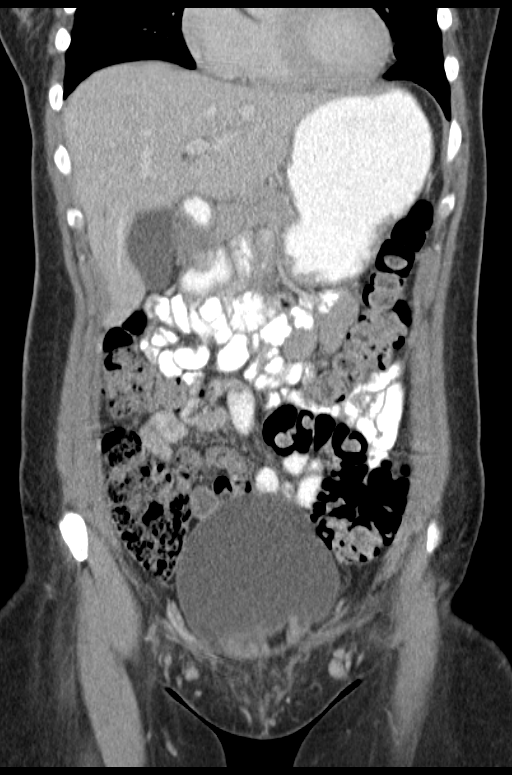
[im 42/94  soft-tissue]
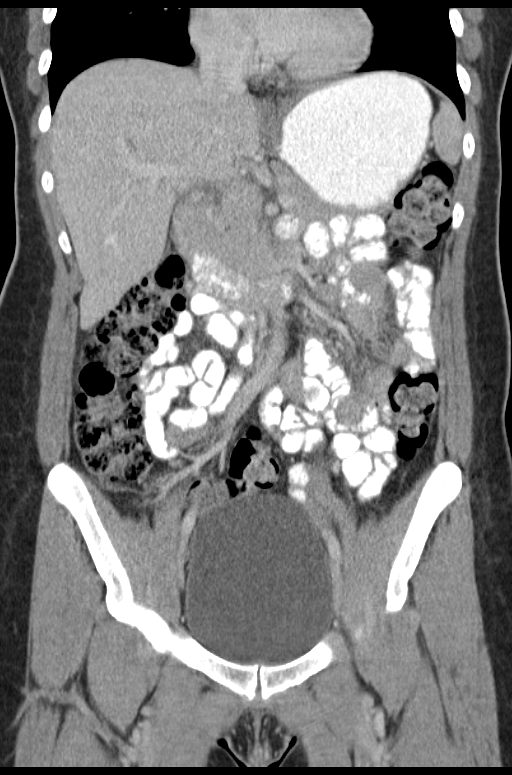
[im 52/94  soft-tissue]
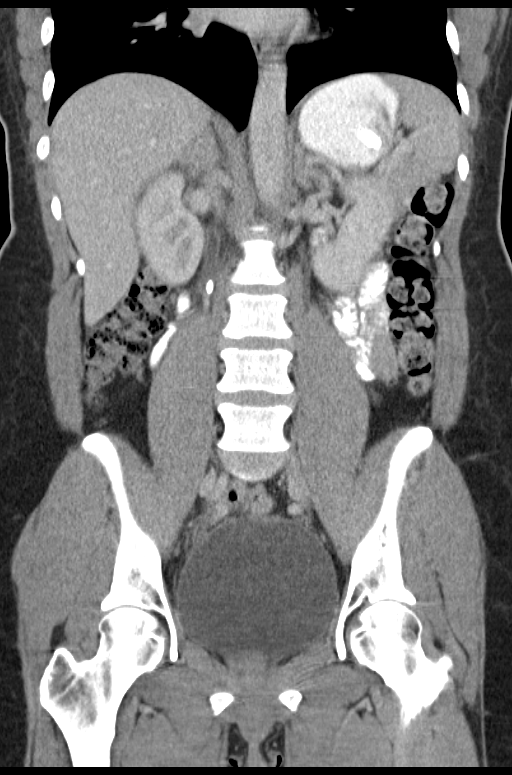

[15 of 46 positions shown; findings below may reference images not displayed]

FINDINGS: Lower Chest:  Lung bases are clear.  Visualized cardiac structures
are within normal limits for size.  Small hiatal hernia.

Abdomen: Unremarkable CT appearance of the stomach, duodenum,
spleen, adrenal glands and pancreas.  Small circumscribed sub
centimeter hypoattenuating lesion in the right hepatic dome is most
consistent with a benign cyst or biliary hamartoma.  No additional
focal hepatic lesions identified. Gallbladder is unremarkable. No
intra or extrahepatic biliary ductal dilatation.

Mild right hydronephrosis.  There is a six by 9 cm partially
obstructing proximal ureteral stone.  On the delayed series, there
is some passage of contrast material into the ureter distal to this
stone.  A punctate 1 - 2 mm nonobstructing stone is identified in
the lower pole of the right kidney.  No hydronephrosis on the left.
There are too small punctate 1 - 2 mm stones in the interpolar
collecting system.  No focal renal lesion.

Normal-caliber large and small bowel throughout the abdomen.  No
significant colonic diverticular disease.  Normal appendix in the
right lower quadrant.   No free fluid or suspicious adenopathy.

Pelvis: The urinary bladder is distended with urine. No distal
ureteral stone.  Surgical changes of prior hysterectomy.  The
ovaries are identified and appear within normal limits for a
reproductive age female.

Bones: No acute fracture or aggressive appearing lytic or blastic
osseous lesion.

Vascular: No significant atherosclerotic vascular disease.
IMPRESSION: 1. Partially obstructing 6 x 9 mm proximal right ureteral stone
with associated mild - moderate right hydronephrosis.

2.  Additional punctate nonobstructing renal calculi bilaterally.

## 2014-03-04 ENCOUNTER — Encounter: Payer: Self-pay | Admitting: Gastroenterology

## 2014-05-02 ENCOUNTER — Ambulatory Visit: Payer: BC Managed Care – PPO | Admitting: Gastroenterology

## 2014-05-12 ENCOUNTER — Other Ambulatory Visit: Payer: Self-pay | Admitting: Internal Medicine

## 2014-06-04 ENCOUNTER — Encounter: Payer: Self-pay | Admitting: Gastroenterology

## 2014-06-04 ENCOUNTER — Ambulatory Visit (INDEPENDENT_AMBULATORY_CARE_PROVIDER_SITE_OTHER): Payer: BLUE CROSS/BLUE SHIELD | Admitting: Gastroenterology

## 2014-06-04 ENCOUNTER — Other Ambulatory Visit: Payer: Self-pay

## 2014-06-04 VITALS — BP 120/79 | HR 84 | Temp 98.2°F | Ht 64.0 in | Wt 192.8 lb

## 2014-06-04 DIAGNOSIS — K5900 Constipation, unspecified: Secondary | ICD-10-CM

## 2014-06-04 DIAGNOSIS — Z1211 Encounter for screening for malignant neoplasm of colon: Secondary | ICD-10-CM

## 2014-06-04 DIAGNOSIS — K269 Duodenal ulcer, unspecified as acute or chronic, without hemorrhage or perforation: Secondary | ICD-10-CM

## 2014-06-04 MED ORDER — PEG 3350-KCL-NA BICARB-NACL 420 G PO SOLR: 4000.0000 mL | Freq: Once | ORAL | Status: DC

## 2014-06-04 NOTE — Progress Notes (Signed)
Referring Provider: Lonzo Cloud, MD Primary Care Physician:  No primary care provider on file.  Chief Complaint  Patient presents with  . Follow-up    HPI:   Suzanne Holloway is a 49 y.o. female presenting today with a history of melena in Aug 2015, undergoing an upper endoscopy revealing moderate erosive gastritis and clean-based duodenal ulcers secondary to NSAIDs. Outside labs from Nov 2015 with Hgb 12.5, ferritin 16  Dealing with constipation now. On iron. Going every 2-3 days. No prior colonoscopy. No hematochezia. No melena. No abdominal pain. Constipation since being on iron. No fiber supplements. PPI BID.   Past Medical History  Diagnosis Date  . Hypertension   . Chronic kidney disease     kidney stone, 2 kidney stones 2004  . GERD (gastroesophageal reflux disease)   . Headache(784.0)     occ.    Past Surgical History  Procedure Laterality Date  . Abdominal hysterectomy    . Cesarean section      x 2  . Foot surgery  2011    Bilateral  . Esophagogastroduodenoscopy N/A 12/24/2013    Dr. Oneida Alar :Schatzki's ring at GE junction, moderate erosive gastritis, multiple non-bleeding ulcers in duodenal bulb secondary to NSAIDs    Current Outpatient Prescriptions  Medication Sig Dispense Refill  . amLODipine-valsartan (EXFORGE) 10-160 MG per tablet Take 1 tablet by mouth every morning.    . docusate sodium (COLACE) 100 MG capsule Take 100 mg by mouth 2 (two) times daily.    . ferrous sulfate 325 (65 FE) MG tablet Take 325 mg by mouth daily with breakfast.    . Multiple Vitamin (MULTIVITAMIN WITH MINERALS) TABS Take 1 tablet by mouth every morning.    Marland Kitchen omeprazole (PRILOSEC) 40 MG capsule Take 1 capsule (40 mg total) by mouth 2 (two) times daily. 60 capsule 3  . polyethylene glycol-electrolytes (NULYTELY/GOLYTELY) 420 G solution Take 4,000 mLs by mouth once. 4000 mL 0   No current facility-administered medications for this visit.    Allergies as of 06/04/2014  . (No  Known Allergies)    Family History  Problem Relation Age of Onset  . Colon cancer Neg Hx     History   Social History  . Marital Status: Married    Spouse Name: N/A  . Number of Children: N/A  . Years of Education: N/A   Occupational History  . [employer]     Manager of Ancillary services   Social History Main Topics  . Smoking status: Never Smoker   . Smokeless tobacco: Never Used  . Alcohol Use: 0.0 oz/week    0 Standard drinks or equivalent per week     Comment: occ  . Drug Use: No  . Sexual Activity: Not on file   Other Topics Concern  . None   Social History Narrative    Review of Systems: As mentioned in HPI  Physical Exam: BP 120/79 mmHg  Pulse 84  Temp(Src) 98.2 F (36.8 C) (Oral)  Ht 5\' 4"  (1.626 m)  Wt 192 lb 12.8 oz (87.454 kg)  BMI 33.08 kg/m2 General:   Alert and oriented. No distress noted. Pleasant and cooperative.  Head:  Normocephalic and atraumatic. Eyes:  Conjuctiva clear without scleral icterus. Mouth:  Oral mucosa pink and moist. Good dentition. No lesions. Heart:  S1, S2 present without murmurs, rubs, or gallops. Regular rate and rhythm. Abdomen:  +BS, soft, non-tender and non-distended. No rebound or guarding. No HSM or masses  noted. Msk:  Symmetrical without gross deformities. Normal posture. Extremities:  Without edema. Neurologic:  Alert and  oriented x4;  grossly normal neurologically. Skin:  Intact without significant lesions or rashes. Psych:  Alert and cooperative. Normal mood and affect.

## 2014-06-04 NOTE — Assessment & Plan Note (Signed)
Secondary to NSAIDs. No evidence of melena or any other concerning GI symptoms. Continues on Prilosec BID. Low normal Hgb and ferritin in Nov 2015; obtain outside labs.

## 2014-06-04 NOTE — Patient Instructions (Signed)
For constipation: start taking supplemental fiber such as metamucil or benefiber daily as directed.   I have provided samples of Linzess 145 mcg to take once each morning on an empty stomach, at least 30 minutes prior to breakfast. This is for constipation. If you have watery stools that last more than a few days, call us.   We have scheduled you for a colonoscopy with Dr. Oneida Alar in the near future.

## 2014-06-04 NOTE — Assessment & Plan Note (Signed)
49 year old female with need for initial screening colonoscopy. No concerning lower GI symptoms to note or family history of colon cancer.  Proceed with colonoscopy with Dr. Oneida Alar in the near future. The risks, benefits, and alternatives have been discussed in detail with the patient. They state understanding and desire to proceed.

## 2014-06-04 NOTE — Assessment & Plan Note (Signed)
Likely exacerbated by iron. Start Linzess 145 mcg daily. Samples provided. If she does well with this, provide prescription. Add supplemental fiber daily.

## 2014-06-14 NOTE — Progress Notes (Signed)
CC'ED TO PCP 

## 2014-06-16 ENCOUNTER — Telehealth: Payer: Self-pay | Admitting: General Practice

## 2014-06-16 MED ORDER — LINACLOTIDE 145 MCG PO CAPS: 145.0000 ug | ORAL_CAPSULE | Freq: Every day | ORAL | Status: AC

## 2014-06-16 NOTE — Telephone Encounter (Signed)
Patient called in wanted Suzanne Holloway to know that the Linzess samples she gave her at her office visit  Was working well and she would a Rx.  Patient is taking Linzess 145 mcg  once each morning on an empty stomach, at least 30 minutes prior to breakfast.   Please send Rx to [facility], [place] to Refill box

## 2014-06-16 NOTE — Addendum Note (Signed)
Addended by: Orvil Feil on: 06/16/2014 02:15 PM   Modules accepted: Orders

## 2014-06-26 NOTE — Progress Notes (Addendum)
REVIEWED-DISCUSSED WITH PT. IRON MADE CONSTIPATION WORSE. FEELS LINZESS HELPS. WILL BRING CO-PAY CARD TO ENDO. RX SENT FEB 22. PT WILL PICKUP VOUCHER IN ENDO. C/O OCCASIONAL BRBPR/PRESSURE FROM HEMORRHOIDS. WOULD LIKE BANDING IF NEEDED. DISCUSSED PROCEDURE; COLONOSCOPY/HEMORRHOID BANDING, BENEFITS, & RISKS: < 1% chance of medication reaction, bleeding, perforation, PELVIC VEIN SEPSIS, or rupture of spleen/liver.

## 2014-06-27 ENCOUNTER — Other Ambulatory Visit: Payer: Self-pay

## 2014-06-30 ENCOUNTER — Encounter (HOSPITAL_COMMUNITY): Payer: Self-pay | Admitting: *Deleted

## 2014-06-30 ENCOUNTER — Encounter (HOSPITAL_COMMUNITY)
Admission: RE | Disposition: A | Discharge status: Home / Self Care | Payer: Self-pay | Source: Ambulatory Visit | Attending: Gastroenterology

## 2014-06-30 ENCOUNTER — Ambulatory Visit (HOSPITAL_COMMUNITY)
Admission: RE | Admit: 2014-06-30 | Discharge: 2014-06-30 | Disposition: A | Discharge status: Home / Self Care | Payer: BLUE CROSS/BLUE SHIELD | Source: Ambulatory Visit | Attending: Gastroenterology | Admitting: Gastroenterology

## 2014-06-30 DIAGNOSIS — K219 Gastro-esophageal reflux disease without esophagitis: Secondary | ICD-10-CM | POA: Diagnosis not present

## 2014-06-30 DIAGNOSIS — Z90710 Acquired absence of both cervix and uterus: Secondary | ICD-10-CM | POA: Insufficient documentation

## 2014-06-30 DIAGNOSIS — Z79899 Other long term (current) drug therapy: Secondary | ICD-10-CM | POA: Insufficient documentation

## 2014-06-30 DIAGNOSIS — I129 Hypertensive chronic kidney disease with stage 1 through stage 4 chronic kidney disease, or unspecified chronic kidney disease: Secondary | ICD-10-CM | POA: Insufficient documentation

## 2014-06-30 DIAGNOSIS — K644 Residual hemorrhoidal skin tags: Secondary | ICD-10-CM | POA: Diagnosis not present

## 2014-06-30 DIAGNOSIS — K621 Rectal polyp: Secondary | ICD-10-CM | POA: Diagnosis not present

## 2014-06-30 DIAGNOSIS — K5730 Diverticulosis of large intestine without perforation or abscess without bleeding: Secondary | ICD-10-CM | POA: Diagnosis not present

## 2014-06-30 DIAGNOSIS — N189 Chronic kidney disease, unspecified: Secondary | ICD-10-CM | POA: Insufficient documentation

## 2014-06-30 DIAGNOSIS — D120 Benign neoplasm of cecum: Secondary | ICD-10-CM | POA: Diagnosis not present

## 2014-06-30 DIAGNOSIS — Z87442 Personal history of urinary calculi: Secondary | ICD-10-CM | POA: Diagnosis not present

## 2014-06-30 DIAGNOSIS — Z1211 Encounter for screening for malignant neoplasm of colon: Secondary | ICD-10-CM | POA: Diagnosis present

## 2014-06-30 HISTORY — PX: COLONOSCOPY: SHX5424

## 2014-06-30 HISTORY — PX: HEMORRHOID BANDING: SHX5850

## 2014-06-30 SURGERY — COLONOSCOPY
Anesthesia: Moderate Sedation

## 2014-06-30 MED ORDER — MEPERIDINE HCL 100 MG/ML IJ SOLN
INTRAMUSCULAR | Status: AC
  Filled 2014-06-30: qty 2

## 2014-06-30 MED ORDER — SODIUM CHLORIDE 0.9 % IV SOLN
INTRAVENOUS | Status: DC
  Administered 2014-06-30: 09:00:00 via INTRAVENOUS

## 2014-06-30 MED ORDER — STERILE WATER FOR IRRIGATION IR SOLN
Status: DC | PRN
  Administered 2014-06-30: 09:00:00

## 2014-06-30 MED ORDER — MIDAZOLAM HCL 5 MG/5ML IJ SOLN
INTRAMUSCULAR | Status: DC | PRN
  Administered 2014-06-30: 2 mg via INTRAVENOUS
  Administered 2014-06-30: 1 mg via INTRAVENOUS
  Administered 2014-06-30: 2 mg via INTRAVENOUS

## 2014-06-30 MED ORDER — MIDAZOLAM HCL 5 MG/5ML IJ SOLN
INTRAMUSCULAR | Status: AC
  Filled 2014-06-30: qty 10

## 2014-06-30 MED ORDER — MEPERIDINE HCL 100 MG/ML IJ SOLN
INTRAMUSCULAR | Status: DC | PRN
  Administered 2014-06-30 (×3): 25 mg via INTRAVENOUS

## 2014-06-30 NOTE — H&P (Signed)
  Primary Care Physician:  Andres Shad, MD Primary Gastroenterologist:  Dr. Oneida Alar  Pre-Procedure History & Physical: HPI:  Suzanne Holloway is a 49 y.o. female here for [place].  Past Medical History  Diagnosis Date  . Hypertension   . Chronic kidney disease     kidney stone, 2 kidney stones 2004  . GERD (gastroesophageal reflux disease)   . Headache(784.0)     occ.    Past Surgical History  Procedure Laterality Date  . Abdominal hysterectomy    . Cesarean section      x 2  . Foot surgery  2011    Bilateral  . Esophagogastroduodenoscopy N/A 12/24/2013    Dr. Oneida Alar :Schatzki's ring at GE junction, moderate erosive gastritis, multiple non-bleeding ulcers in duodenal bulb secondary to NSAIDs    Prior to Admission medications   Medication Sig Start Date End Date Taking? Authorizing Provider  amLODipine-valsartan (EXFORGE) 10-160 MG per tablet Take 1 tablet by mouth every morning.   Yes Historical Provider, MD  Linaclotide Rolan Lipa) 145 MCG CAPS capsule Take 1 capsule (145 mcg total) by mouth daily. 06/16/14  Yes Orvil Feil, NP  Multiple Vitamin (MULTIVITAMIN WITH MINERALS) TABS Take 1 tablet by mouth every morning.   Yes Historical Provider, MD  omeprazole (PRILOSEC) 40 MG capsule Take 1 capsule (40 mg total) by mouth 2 (two) times daily. 12/24/13  Yes Nishant Dhungel, MD  polyethylene glycol-electrolytes (NULYTELY/GOLYTELY) 420 G solution Take 4,000 mLs by mouth once. 06/04/14  Yes Orvil Feil, NP    Allergies as of 06/04/2014  . (No Known Allergies)    Family History  Problem Relation Age of Onset  . Colon cancer Neg Hx   . Other Father     History   Social History  . Marital Status: Married    Spouse Name: N/A  . Number of Children: N/A  . Years of Education: N/A   Occupational History  . [employer]     Manager of Ancillary services   Social History Main Topics  . Smoking status: Never Smoker   . Smokeless tobacco: Never  Used  . Alcohol Use: 0.0 oz/week    0 Standard drinks or equivalent per week     Comment: occ  . Drug Use: No  . Sexual Activity: Not on file   Other Topics Concern  . Not on file   Social History Narrative    Review of Systems: See HPI, otherwise negative ROS   Physical Exam: Ht 5\' 3"  (1.6 m)  Wt 190 lb (86.183 kg)  BMI 33.67 kg/m2 General:   Alert,  pleasant and cooperative in NAD Head:  Normocephalic and atraumatic. Neck:  Supple; Lungs:  Clear throughout to auscultation.    Heart:  Regular rate and rhythm. Abdomen:  Soft, nontender and nondistended. Normal bowel sounds, without guarding, and without rebound.   Neurologic:  Alert and  oriented x4;  grossly normal neurologically.  Impression/Plan:    SCREENING  Plan:  1. TCS TODAY

## 2014-06-30 NOTE — Op Note (Signed)
[name], [address]   COLONOSCOPY PROCEDURE REPORT  PATIENT: Suzanne Holloway, Suzanne Holloway  MR#: [id number] BIRTHDATE: 1965/09/14 , 48  yrs. old GENDER: female ENDOSCOPIST: Danie Binder, MD REFERRED WL:[id number] Vivi Martens, M.D. PROCEDURE DATE:  07/25/14 PROCEDURE:   Colonoscopy with cold biopsy polypectomy INDICATIONS:average risk patient for colon cancer.  USING LINZESS AND IT'S HELPING. MEDICATIONS: Demerol 75 mg IV and Versed 5 mg IV  DESCRIPTION OF PROCEDURE:    Physical exam was performed.  Informed consent was obtained from the patient after explaining the benefits, risks, and alternatives to procedure.  The patient was connected to monitor and placed in left lateral position. Continuous oxygen was provided by nasal cannula and IV medicine administered through an indwelling cannula.  After administration of sedation and rectal exam, the patients rectum was intubated and the EC-3890Li ([id number])  colonoscope was advanced under direct visualization to the cecum.  The scope was removed slowly by carefully examining the color, texture, anatomy, and integrity mucosa on the way out.  The patient was recovered in endoscopy and discharged home in satisfactory condition.    COLON FINDINGS: The colon was redundant.  Manual abdominal counter-pressure was used to reach the cecum, There was mild diverticulosis noted in the transverse colon.  , Two sessile polyps ranging from 2 to 86mm in size were found at the cecum.  A polypectomy was performed with cold forceps.  , and Moderate sized external hemorrhoids were found.  PREP QUALITY: excellent. CECAL W/D TIME: 12       minutes  COMPLICATIONS: None  ENDOSCOPIC IMPRESSION: 1.   The LEFT colon IS redundant 2.   Mild diverticulosis n the transverse colon 3.   Two COLON POLYPS REMOVED 4.   Moderate sized external hemorrhoids  RECOMMENDATIONS: FOLLOW A HIGH FIBER DIET.  AVOID ITEMS THAT CAUSE  BLOATING. DRINK WATER TO KEEP YOUR URINE LIGHT YELLOW. CONTINUE LINZESS. AWAIT BIOPSY USE PREPARATION H FOUR TIMES A DAY AS NEEDED TO RELIEVE RECTAL PAIN/PRESSURE/BLEEDING. Next colonoscopy in 10 years WITH AN OVERTUBE.   eSigned:  Danie Binder, MD 07/25/14 3:10 PM    CPT CODES: ICD CODES:  The ICD and CPT codes recommended by this software are interpretations from the data that the clinical staff has captured with the software.  The verification of the translation of this report to the ICD and CPT codes and modifiers is the sole responsibility of the health care institution and practicing physician where this report was generated.  [place]. will not be held responsible for the validity of the ICD and CPT codes included on this report.  AMA assumes no liability for data contained or not contained herein. CPT is a Designer, television/film set of the [employer].

## 2014-06-30 NOTE — Discharge Instructions (Signed)
You had 2 small polyps removed. You have small internal hemorrhoids and moderate size external hemorrhoids. You have diverticulosis IN transverse COLON.   FOLLOW A HIGH FIBER DIET. AVOID ITEMS THAT CAUSE BLOATING. SEE INFO BELOW.  DRINK WATER TO KEEP YOUR URINE LIGHT YELLOW.  CONTINUE LINZESS.  YOUR BIOPSY RESULTS WILL BE AVAILABLE IN MY CHART AFTER MAR 9  OR MY OFFICE WILL CONTACT YOU IN 10-14 DAYS WITH YOUR RESULTS.   USE PREPARATION H FOUR TIME  A DAY AS NEEDED TO RELIEVE RECTAL PAIN/PRESSURE/BLEEDING.  Next colonoscopy in 10 years.   Colonoscopy Care After Read the instructions outlined below and refer to this sheet in the next week. These discharge instructions provide you with general information on caring for yourself after you leave the hospital. While your treatment has been planned according to the most current medical practices available, unavoidable complications occasionally occur. If you have any problems or questions after discharge, call DR. Mackenzey Crownover, [phone].  ACTIVITY  You may resume your regular activity, but move at a slower pace for the next 24 hours.   Take frequent rest periods for the next 24 hours.   Walking will help get rid of the air and reduce the bloated feeling in your belly (abdomen).   No driving for 24 hours (because of the medicine (anesthesia) used during the test).   You may shower.   Do not sign any important legal documents or operate any machinery for 24 hours (because of the anesthesia used during the test).    NUTRITION  Drink plenty of fluids.   You may resume your normal diet as instructed by your doctor.   Begin with a light meal and progress to your normal diet. Heavy or fried foods are harder to digest and may make you feel sick to your stomach (nauseated).   Avoid alcoholic beverages for 24 hours or as instructed.    MEDICATIONS  You may resume your normal medications.   WHAT YOU CAN EXPECT TODAY  Some feelings of  bloating in the abdomen.   Passage of more gas than usual.   Spotting of blood in your stool or on the toilet paper  .  IF YOU HAD POLYPS REMOVED DURING THE COLONOSCOPY:  Eat a soft diet IF YOU HAVE NAUSEA, BLOATING, ABDOMINAL PAIN, OR VOMITING.    FINDING OUT THE RESULTS OF YOUR TEST Not all test results are available during your visit. DR. Oneida Alar WILL CALL YOU WITHIN 14 DAYS OF YOUR PROCEDUE WITH YOUR RESULTS. Do not assume everything is normal if you have not heard from DR. Payslee Bateson CALL HER OFFICE AT [phone].  SEEK IMMEDIATE MEDICAL ATTENTION AND CALL THE OFFICE: [phone] IF:  You have more than a spotting of blood in your stool.   Your belly is swollen (abdominal distention).   You are nauseated or vomiting.   You have a temperature over 101F.   You have abdominal pain or discomfort that is severe or gets worse throughout the day.  Polyps, Colon  A polyp is extra tissue that grows inside your body. Colon polyps grow in the large intestine. The large intestine, also called the colon, is part of your digestive system. It is a long, hollow tube at the end of your digestive tract where your body makes and stores stool. Most polyps are not dangerous. They are benign. This means they are not cancerous. But over time, some types of polyps can turn into cancer. Polyps that are smaller than a pea are usually not  harmful. But larger polyps could someday become or may already be cancerous. To be safe, doctors remove all polyps and test them.   WHO GETS POLYPS? Anyone can get polyps, but certain people are more likely than others. You may have a greater chance of getting polyps if:  You are over 50.   You have had polyps before.   Someone in your family has had polyps.   Someone in your family has had cancer of the large intestine.   Find out if someone in your family has had polyps. You may also be more likely to get polyps if you:   Eat a lot of fatty foods   Smoke    Drink alcohol   Do not exercise  Eat too much   TREATMENT  The caregiver will remove the polyp during sigmoidoscopy or colonoscopy.  PREVENTION There is not one sure way to prevent polyps. You might be able to lower your risk of getting them if you:  Eat more fruits and vegetables and less fatty food.   Do not smoke.   Avoid alcohol.   Exercise every day.   Lose weight if you are overweight.   Eating more calcium and folate can also lower your risk of getting polyps. Some foods that are rich in calcium are milk, cheese, and broccoli. Some foods that are rich in folate are chickpeas, kidney beans, and spinach.   High-Fiber Diet A high-fiber diet changes your normal diet to include more whole grains, legumes, fruits, and vegetables. Changes in the diet involve replacing refined carbohydrates with unrefined foods. The calorie level of the diet is essentially unchanged. The Dietary Reference Intake (recommended amount) for adult males is 38 grams per day. For adult females, it is 25 grams per day. Pregnant and lactating women should consume 28 grams of fiber per day. Fiber is the intact part of a plant that is not broken down during digestion. Functional fiber is fiber that has been isolated from the plant to provide a beneficial effect in the body. PURPOSE  Increase stool bulk.   Ease and regulate bowel movements.   Lower cholesterol.  INDICATIONS THAT YOU NEED MORE FIBER  Constipation and hemorrhoids.   Uncomplicated diverticulosis (intestine condition) and irritable bowel syndrome.   Weight management.   As a protective measure against hardening of the arteries (atherosclerosis), diabetes, and cancer.   GUIDELINES FOR INCREASING FIBER IN THE DIET  Start adding fiber to the diet slowly. A gradual increase of about 5 more grams (2 slices of whole-wheat bread, 2 servings of most fruits or vegetables, or 1 bowl of high-fiber cereal) per day is best. Too rapid an increase in  fiber may result in constipation, flatulence, and bloating.   Drink enough water and fluids to keep your urine clear or pale yellow. Water, juice, or caffeine-free drinks are recommended. Not drinking enough fluid may cause constipation.   Eat a variety of high-fiber foods rather than one type of fiber.   Try to increase your intake of fiber through using high-fiber foods rather than fiber pills or supplements that contain small amounts of fiber.   The goal is to change the types of food eaten. Do not supplement your present diet with high-fiber foods, but replace foods in your present diet.  INCLUDE A VARIETY OF FIBER SOURCES  Replace refined and processed grains with whole grains, canned fruits with fresh fruits, and incorporate other fiber sources. White rice, white breads, and most bakery goods contain little or  no fiber.   Brown whole-grain rice, buckwheat oats, and many fruits and vegetables are all good sources of fiber. These include: broccoli, Brussels sprouts, cabbage, cauliflower, beets, sweet potatoes, white potatoes (skin on), carrots, tomatoes, eggplant, squash, berries, fresh fruits, and dried fruits.   Cereals appear to be the richest source of fiber. Cereal fiber is found in whole grains and bran. Bran is the fiber-rich outer coat of cereal grain, which is largely removed in refining. In whole-grain cereals, the bran remains. In breakfast cereals, the largest amount of fiber is found in those with "bran" in their names. The fiber content is sometimes indicated on the label.   You may need to include additional fruits and vegetables each day.   In baking, for 1 cup white flour, you may use the following substitutions:   1 cup whole-wheat flour minus 2 tablespoons.   1/2 cup white flour plus 1/2 cup whole-wheat flour.   Diverticulosis Diverticulosis is a common condition that develops when small pouches (diverticula) form in the wall of the colon. The risk of diverticulosis  increases with age. It happens more often in people who eat a low-fiber diet. Most individuals with diverticulosis have no symptoms. Those individuals with symptoms usually experience belly (abdominal) pain, constipation, or loose stools (diarrhea).  HOME CARE INSTRUCTIONS  Increase the amount of fiber in your diet as directed by your caregiver or dietician. This may reduce symptoms of diverticulosis.   Drink at least 6 to 8 glasses of water each day to prevent constipation.   Try not to strain when you have a bowel movement.   Avoiding nuts and seeds to prevent complications is still an uncertain benefit.       FOODS HAVING HIGH FIBER CONTENT INCLUDE:  Fruits. Apple, peach, pear, tangerine, raisins, prunes.   Vegetables. Brussels sprouts, asparagus, broccoli, cabbage, carrot, cauliflower, romaine lettuce, spinach, summer squash, tomato, winter squash, zucchini.   Starchy Vegetables. Baked beans, kidney beans, lima beans, split peas, lentils, potatoes (with skin).   Grains. Whole wheat bread, brown rice, bran flake cereal, plain oatmeal, white rice, shredded wheat, bran muffins.    SEEK IMMEDIATE MEDICAL CARE IF:  You develop increasing pain or severe bloating.   You have an oral temperature above 101F.   You develop vomiting or bowel movements that are bloody or black.   Hemorrhoids Hemorrhoids are dilated (enlarged) veins around the rectum. Sometimes clots will form in the veins. This makes them swollen and painful. These are called thrombosed hemorrhoids. Causes of hemorrhoids include:  Constipation.   Straining to have a bowel movement.   HEAVY LIFTING HOME CARE INSTRUCTIONS  Eat a well balanced diet and drink 6 to 8 glasses of water every day to avoid constipation. You may also use a bulk laxative.   Avoid straining to have bowel movements.   Keep anal area dry and clean.   Do not use a donut shaped pillow or sit on the toilet for long periods. This increases  blood pooling and pain.   Move your bowels when your body has the urge; this will require less straining and will decrease pain and pressure.

## 2014-07-01 ENCOUNTER — Encounter (HOSPITAL_COMMUNITY): Payer: Self-pay | Admitting: Gastroenterology

## 2014-07-04 ENCOUNTER — Telehealth: Payer: Self-pay | Admitting: Gastroenterology

## 2014-07-04 NOTE — Telephone Encounter (Signed)
Please call pt. She had ONE simple adenoma & ONE HYPERPLASTIC POLYP removed.   FOLLOW A HIGH FIBER DIET. AVOID ITEMS THAT CAUSE BLOATING. SEE INFO BELOW.  DRINK WATER TO KEEP YOUR URINE LIGHT YELLOW.  CONTINUE LINZESS.   USE PREPARATION H FOUR TIME  A DAY AS NEEDED TO RELIEVE RECTAL PAIN/PRESSURE/BLEEDING.  FOLLOW UP IN JUN 2016 FOR CONSTIPATION  Next colonoscopy in 5-10 years.

## 2014-07-07 ENCOUNTER — Encounter: Payer: Self-pay | Admitting: Gastroenterology

## 2014-07-07 LAB — HEMOGLOBIN
Ferritin: 16.35
HCT: 41 %
HEMOGLOBIN (KUC): 12.5 g/dL (ref 11.8–15.5)

## 2014-07-07 NOTE — Telephone Encounter (Signed)
Pt is aware of results. 

## 2014-07-07 NOTE — Telephone Encounter (Signed)
APPOINTMENT MADE AND LETTER SENT   [facility]CS ON RECALL LIST

## 2014-10-07 ENCOUNTER — Ambulatory Visit: Payer: BLUE CROSS/BLUE SHIELD | Admitting: Gastroenterology

## 2014-10-09 ENCOUNTER — Ambulatory Visit: Payer: BLUE CROSS/BLUE SHIELD | Admitting: Nurse Practitioner

## 2015-08-20 IMAGING — CR DG CHEST 1V PORT
1 series · 1 of 1 positions shown · non-contrast
Comparison: 06/18/2003

CLINICAL DATA: Dizziness.  Melena.  Vomiting.

EXAM:
PORTABLE CHEST - 1 VIEW

[portable]
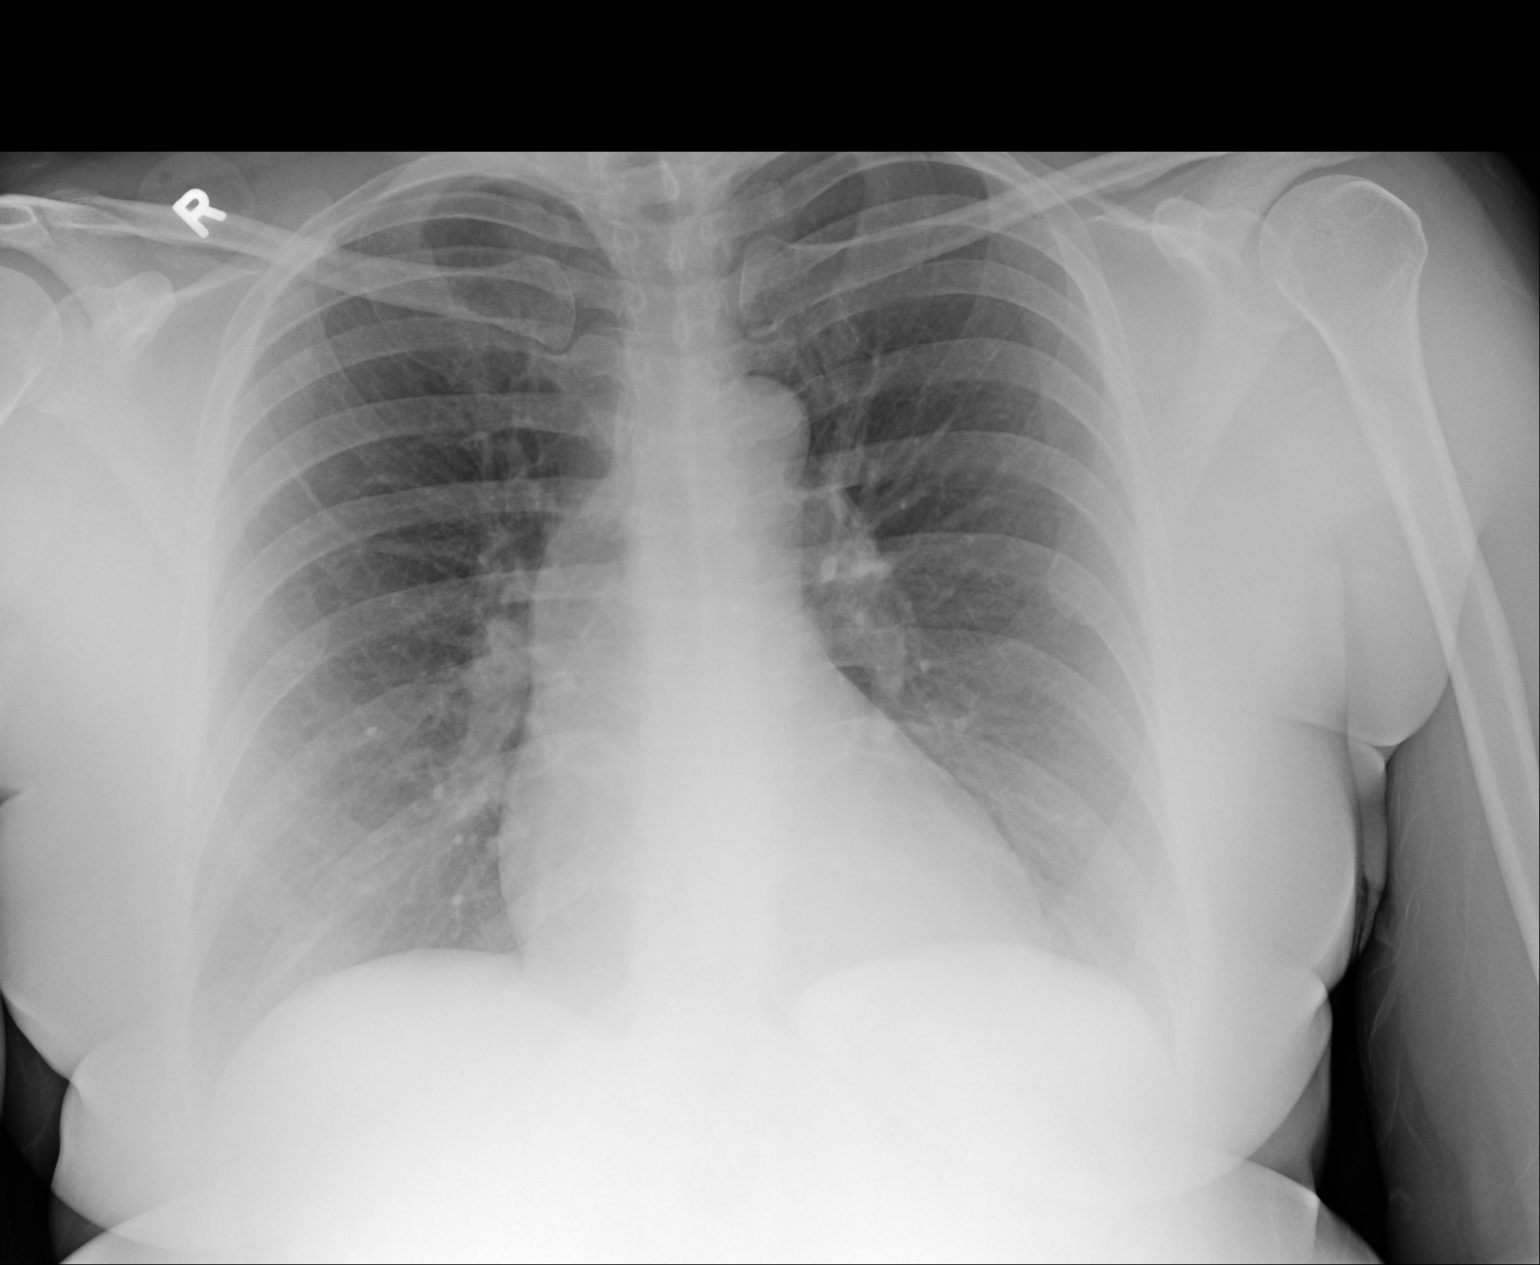

[1 of 1 positions shown; findings below may reference images not displayed]

FINDINGS: The heart size and mediastinal contours are within normal limits.
Both lungs are clear. The visualized skeletal structures are
unremarkable.
IMPRESSION: No active disease.

## 2016-09-16 ENCOUNTER — Other Ambulatory Visit: Payer: Self-pay | Admitting: Gastroenterology

## 2017-01-07 ENCOUNTER — Other Ambulatory Visit: Payer: Self-pay | Admitting: Gastroenterology

## 2018-07-04 NOTE — Telephone Encounter (Signed)
Formatting of this note might be different from the original.  Needs to be seen or get from PCP  Electronically signed by Winfield Rast, CPhT at 07/04/2018 11:11 AM EDT

## 2019-05-10 NOTE — Progress Notes (Signed)
Formatting of this note might be different from the original.    Self Swab Type: Anterior Nasal  Electronically signed by Marye Round at 05/10/2019  4:28 PM EST

## 2019-06-03 ENCOUNTER — Encounter: Payer: Self-pay | Admitting: Gastroenterology

## 2019-09-17 ENCOUNTER — Encounter: Payer: Self-pay | Admitting: *Deleted

## 2019-11-28 ENCOUNTER — Ambulatory Visit: Payer: BLUE CROSS/BLUE SHIELD

## 2022-01-11 ENCOUNTER — Inpatient Hospital Stay: Admit: 2022-01-11 | Payer: PRIVATE HEALTH INSURANCE

## 2022-01-11 ENCOUNTER — Inpatient Hospital Stay: Admit: 2022-01-12 | Payer: PRIVATE HEALTH INSURANCE

## 2022-01-11 DIAGNOSIS — Z0000 Encounter for general adult medical examination without abnormal findings: Secondary | ICD-10-CM

## 2022-01-12 LAB — COMPREHENSIVE METABOLIC PANEL
ALT: 18 U/L (ref 10–49)
AST: 22 U/L (ref 0.0–33.9)
Albumin: 3.9 gm/dl (ref 3.4–5.0)
Alkaline Phosphatase: 97 U/L (ref 46–116)
Anion Gap: 8 mmol/L (ref 5–15)
BUN: 12 mg/dl (ref 9–23)
CO2: 29 mEq/L (ref 20–31)
Calcium: 9.2 mg/dl (ref 8.7–10.4)
Chloride: 105 mEq/L (ref 98–107)
Creatinine: 0.7 mg/dl (ref 0.55–1.02)
GFR African American: >60
GFR Non-African American: >60
Glucose: 82 mg/dl (ref 74–106)
Potassium: 3.8 mEq/L (ref 3.5–5.1)
Sodium: 142 mEq/L (ref 136–145)
Total Bilirubin: 1 mg/dl (ref 0.30–1.20)
Total Protein: 6.8 gm/dl (ref 5.7–8.2)

## 2022-01-12 LAB — CBC WITH AUTO DIFFERENTIAL
Basophils: 0.7 % (ref 0–3)
Eosinophils: 2.9 % (ref 0–5)
Hematocrit: 42.1 % (ref 35.0–47.0)
Hemoglobin: 13.5 gm/dl (ref 11.0–16.0)
Immature Granulocytes: 0.2 % (ref 0.0–3.0)
Lymphocytes: 40.9 % (ref 28–48)
MCH: 32.1 pg (ref 25.4–34.6)
MCHC: 32.1 gm/dl (ref 30.0–36.0)
MCV: 100 fL — ABNORMAL HIGH (ref 80.0–98.0)
MPV: 12.1 fL — ABNORMAL HIGH (ref 6.0–10.0)
Monocytes: 6.7 % (ref 1–13)
Neutrophils Segmented: 48.6 % (ref 34–64)
Nucleated RBCs: 0 (ref 0–0)
Platelets: 179 10*3/uL (ref 140–450)
RBC: 4.21 M/uL (ref 3.60–5.20)
RDW: 45.3 (ref 36.4–46.3)
WBC: 5.6 10*3/uL (ref 4.0–11.0)

## 2022-01-12 LAB — TSH: TSH, High Sensitivity: 0.961 u[IU]/mL (ref 0.550–4.780)

## 2022-01-12 LAB — LIPID PANEL
Chol/HDL Ratio: 2.3 Ratio (ref 0.0–4.4)
Cholesterol: 189 mg/dl (ref 0–199)
HDL: 83 mg/dl — ABNORMAL HIGH (ref 40–60)
LDL Calculated: 95 mg/dl (ref 0–130)
Triglycerides: 55 mg/dl (ref 0–150)

## 2022-01-12 LAB — T4, FREE: T4 Free: 1.1 ng/dl (ref 0.89–1.76)

## 2022-01-12 LAB — VITAMIN D 25 HYDROXY: Vit D, 25-Hydroxy: 37.6 ng/ml (ref 30.0–100.0)

## 2022-02-10 ENCOUNTER — Inpatient Hospital Stay: Admit: 2022-02-10 | Payer: PRIVATE HEALTH INSURANCE

## 2022-02-23 ENCOUNTER — Encounter

## 2022-04-22 ENCOUNTER — Ambulatory Visit: Payer: PRIVATE HEALTH INSURANCE

## 2022-04-22 DIAGNOSIS — Z1231 Encounter for screening mammogram for malignant neoplasm of breast: Secondary | ICD-10-CM

## 2022-06-02 ENCOUNTER — Inpatient Hospital Stay: Admit: 2022-06-02 | Payer: PRIVATE HEALTH INSURANCE

## 2022-09-29 ENCOUNTER — Inpatient Hospital Stay: Admit: 2022-09-29 | Payer: PRIVATE HEALTH INSURANCE

## 2022-11-09 ENCOUNTER — Inpatient Hospital Stay: Admit: 2022-11-09 | Payer: PRIVATE HEALTH INSURANCE

## 2023-02-09 ENCOUNTER — Inpatient Hospital Stay: Admit: 2023-02-09 | Payer: PRIVATE HEALTH INSURANCE

## 2023-02-09 DIAGNOSIS — K921 Melena: Secondary | ICD-10-CM

## 2023-02-10 LAB — COMPREHENSIVE METABOLIC PANEL
ALT: 16 U/L (ref 10–49)
AST: 21 U/L (ref 0.0–33.9)
Albumin: 3.7 g/dL (ref 3.4–5.0)
Alkaline Phosphatase: 81 U/L (ref 46–116)
Anion Gap: 4 mmol/L — ABNORMAL LOW (ref 5–15)
BUN: 19 mg/dL (ref 9–23)
CO2: 31 meq/L (ref 20–31)
Calcium: 9.7 mg/dL (ref 8.7–10.4)
Chloride: 106 meq/L (ref 98–107)
Creatinine: 0.69 mg/dL (ref 0.55–1.02)
GFR African American: >60
GFR Non-African American: >60
Glucose: 92 mg/dL (ref 74–106)
Potassium: 3.9 meq/L (ref 3.5–5.1)
Sodium: 141 meq/L (ref 136–145)
Total Bilirubin: 0.6 mg/dL (ref 0.30–1.20)
Total Protein: 6.9 g/dL (ref 5.7–8.2)

## 2023-02-10 LAB — CBC WITH AUTO DIFFERENTIAL
Basophils: 0.3 % (ref 0–3)
Eosinophils: 2.7 % (ref 0–5)
Hematocrit: 39.1 % (ref 35.0–47.0)
Hemoglobin: 12.1 g/dL (ref 11.0–16.0)
Immature Granulocytes %: 0.3 % (ref 0.0–3.0)
Lymphocytes: 35.4 % (ref 28–48)
MCH: 32.1 pg (ref 25.4–34.6)
MCHC: 30.9 g/dL (ref 30.0–36.0)
MCV: 103.7 fL — ABNORMAL HIGH (ref 80.0–98.0)
MPV: 11.3 fL — ABNORMAL HIGH (ref 6.0–10.0)
Monocytes: 7 % (ref 1–13)
Neutrophils Segmented: 54.3 % (ref 34–64)
Nucleated RBCs: 0 (ref 0–0)
Platelets: 203 10*3/uL (ref 140–450)
RBC: 3.77 M/uL (ref 3.60–5.20)
RDW: 48 — ABNORMAL HIGH (ref 36.4–46.3)
WBC: 6.6 10*3/uL (ref 4.0–11.0)

## 2023-02-10 LAB — TIBC: TIBC: 310 ug/dL (ref 250–425)

## 2023-02-10 LAB — IRON: Iron: 58 ug/dL (ref 50–170)

## 2023-02-10 LAB — FERRITIN: Ferritin: 34.8 ng/mL (ref 7.3–270.7)

## 2023-03-15 ENCOUNTER — Ambulatory Visit: Admit: 2023-03-15 | Discharge: 2023-03-15 | Payer: PRIVATE HEALTH INSURANCE | Primary: Physician Assistant

## 2023-03-15 VITALS — Resp 16 | Ht 65.0 in | Wt 163.0 lb

## 2023-03-15 DIAGNOSIS — N200 Calculus of kidney: Principal | ICD-10-CM

## 2023-03-15 LAB — AMB POC URINALYSIS DIP STICK AUTO W/O MICRO
Bilirubin, Urine, POC: NEGATIVE
Glucose, Urine, POC: NEGATIVE
Ketones, Urine, POC: NEGATIVE
Nitrite, Urine, POC: NEGATIVE
Protein, Urine, POC: NEGATIVE
Specific Gravity, Urine, POC: 1.015 (ref 1.001–1.035)
Urobilinogen, POC: NORMAL
pH, Urine, POC: 6.5 (ref 4.6–8.0)

## 2023-03-15 NOTE — Progress Notes (Signed)
 NEW PATIENT  Dr. Fran Lowes is the supervising physician for this day.    Doris Roach  DOB: 08/20/1965  Encounter Date: 03/15/2023       ICD-10-CM    1. Kidney stones  N20.0 AMB POC URINALYSIS DIP STICK AUTO W/O MICRO     CT ABDOMEN PELVIS WO CONTRA

## 2023-03-15 NOTE — Telephone Encounter (Signed)
 Willy Eddy has an order for CT SCAN      ALL FEMALE PATIENTS NEEDING AN MRI OF PELVIS OR PROSTATE, MUST BE SCHEDULED AT ONE OF THE FOLLOWING:    **MRI/CT (Preferred [facility])  **[facility] @ [facility]

## 2023-03-16 LAB — URINALYSIS WITH MICROSCOPIC
Bilirubin, Urine: NEGATIVE
Glucose, Ur: NEGATIVE
Ketones, Urine: NEGATIVE
Nitrite, Urine: NEGATIVE
Protein, UA: NEGATIVE
Specific Gravity, UA: 1.02 (ref 1.005–1.030)
Urobilinogen, Urine: 1 mg/dL (ref 0.2–1.0)
pH, Urine: 6.5 (ref 5.0–7.5)

## 2023-03-16 LAB — MICROSCOPIC EXAMINATION
Casts UA: NONE SEEN /LPF
Epithelial Cells, UA: >10 /HPF — AB (ref 0–10)
RBC, UA: NONE SEEN /HPF (ref 0–2)

## 2023-03-16 NOTE — Other (Signed)
 Microscopic urinalysis negative for red blood cells

## 2023-03-17 LAB — CULTURE, URINE: Urine Culture, Routine: NO GROWTH

## 2023-03-17 NOTE — Other (Signed)
 Negative urine culture

## 2023-03-17 NOTE — Telephone Encounter (Signed)
Faxed CT A/P order to [facility] [phone]

## 2023-03-30 ENCOUNTER — Encounter: Admit: 2023-03-30

## 2023-03-30 DIAGNOSIS — N200 Calculus of kidney: Secondary | ICD-10-CM

## 2023-04-03 NOTE — Other (Signed)
 Reviewed. Will discuss with patient at visit on 04/13/23 with Dr. Rosiland Oz.    Helane Gunther, PA-C  [facility], [place]

## 2023-04-10 ENCOUNTER — Encounter: Admit: 2023-04-10 | Admitting: Physician Assistant

## 2023-04-10 DIAGNOSIS — Z1231 Encounter for screening mammogram for malignant neoplasm of breast: Secondary | ICD-10-CM

## 2023-04-12 ENCOUNTER — Encounter: Admit: 2023-04-12 | Payer: PRIVATE HEALTH INSURANCE | Admitting: Nurse Practitioner | Primary: Physician Assistant

## 2023-04-12 VITALS — Resp 16 | Ht 65.0 in | Wt 163.0 lb

## 2023-04-12 DIAGNOSIS — N200 Calculus of kidney: Secondary | ICD-10-CM

## 2023-04-12 LAB — AMB POC URINALYSIS DIP STICK AUTO W/O MICRO
Bilirubin, Urine, POC: NEGATIVE
Glucose, Urine, POC: NEGATIVE
Ketones, Urine, POC: NEGATIVE
Nitrite, Urine, POC: NEGATIVE
Protein, Urine, POC: NEGATIVE
Specific Gravity, Urine, POC: 1.015 (ref 1.001–1.035)
Urobilinogen, POC: 0.2
pH, Urine, POC: 7 (ref 4.6–8.0)

## 2023-04-12 NOTE — Progress Notes (Signed)
.      Dr.Bughs the supervising physician for this day.    Doris Roach  DOB: Apr 10, 1966  Encounter Date: 04/12/2023       ICD-10-CM    1. Kidney stones  N20.0 AMB POC URINALYSIS DIP STICK AUTO W/O MICRO     Culture, Urine      2. Microscopic hematu

## 2023-04-14 LAB — CULTURE, URINE: Urine Culture, Routine: NO GROWTH

## 2023-04-20 NOTE — Telephone Encounter (Addendum)
 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     Called and Schedule Patient for NP consult with Dr.Murthy     Appt details reviewed with Patient , Copy sent to Patient VIA Email @ [email or web address]         CONSULTATION WITH  Dr.Shash

## 2023-04-20 NOTE — Telephone Encounter (Signed)
 MR DIGITIZED ORDER PLACE AND SIGNED     MR LEFT KNEE PERFORMED AT MRI/CT on 04/11/2023 Uploaded

## 2023-04-24 ENCOUNTER — Inpatient Hospital Stay
Admit: 2023-04-24 | Disposition: A | Discharge status: Home / Self Care | Payer: PRIVATE HEALTH INSURANCE | Admitting: Physician Assistant | Primary: Physician Assistant

## 2023-04-24 DIAGNOSIS — Z1231 Encounter for screening mammogram for malignant neoplasm of breast: Secondary | ICD-10-CM

## 2023-04-28 ENCOUNTER — Encounter: Admit: 2023-04-28 | Admitting: Physician Assistant

## 2023-04-28 DIAGNOSIS — R928 Other abnormal and inconclusive findings on diagnostic imaging of breast: Secondary | ICD-10-CM

## 2023-05-01 NOTE — Telephone Encounter (Signed)
Patient was seen by Jeri Cos on 04/12/23  and was told that someone will call him back for the surgery schedule;    No surgery letter on the chart yet    Please call patient back on what is the status now.    CBN: [phone]

## 2023-05-04 NOTE — Telephone Encounter (Signed)
I spoke to Doris Roach this afternoon. Doris Roach is aware that she is scheduled to have surgery with Dr. Maple Hudson at the [facility] on 05/11/23. Doris Roach is aware that I will email her out a surgery letter regarding all of the information that we went over today.

## 2023-05-04 NOTE — Telephone Encounter (Signed)
Called pt to schedule surgery and this will be on 02/25 with Dr. Henrene Hawking at [facility]. No clearances are needed. I will email the patient a surgery letter. All questions were answered and she understood.

## 2023-05-05 ENCOUNTER — Encounter: Admit: 2023-05-05 | Discharge: 2023-05-05 | Payer: PRIVATE HEALTH INSURANCE | Primary: Physician Assistant

## 2023-05-05 VITALS — Resp 16 | Ht 65.0 in | Wt 184.0 lb

## 2023-05-05 DIAGNOSIS — 1 ERRONEOUS ENCOUNTER ICD10: Secondary | ICD-10-CM

## 2023-05-08 ENCOUNTER — Encounter: Admit: 2023-05-08 | Discharge: 2023-05-08 | Payer: PRIVATE HEALTH INSURANCE | Primary: Physician Assistant

## 2023-05-08 VITALS — Temp 97.60000°F | Ht 65.0 in | Wt 184.0 lb

## 2023-05-08 DIAGNOSIS — N200 Calculus of kidney: Secondary | ICD-10-CM

## 2023-05-08 NOTE — Telephone Encounter (Signed)
Patient was in office last week for culture that never was sent out for testing.  Doris Roach (nurse that did straight cath [facility]

## 2023-05-09 LAB — LITHOLINK 24 HOUR URINE PANEL
Ammonium, Urine: 22 mmol/(24.h) (ref 15–60)
Calcium Creatinine Ratio: 151 mg/g{creat} (ref 51–262)
Calcium Oxalate Saturation: 5.26 — ABNORMAL LOW (ref 6.00–10.00)
Calcium Phosphate Saturation: 2.7 — ABNORMAL HIGH (ref 0.50–2.00)
Calcium, Urine: 185 mg/(24.h) (ref <200)
Calcium/Kg Body Weight: 2.2 mg/kg/d (ref <4.0)
Chloride Urine: 125 mmol/(24.h) (ref 70–250)
Citrate, Ur: 499 mg/(24.h) — ABNORMAL LOW (ref >550)
Creatinine, Ur: 1225 mg/(24.h)
Creatinine/Kg Body Weight: 14.4 mg/kg/d (ref 8.7–20.3)
Cystine, Urine: NEGATIVE
Magnesium, Ur: 88 mg/(24.h) (ref 30–120)
Oxalate, Ur: 29 mg/(24.h) (ref 20–40)
Phosphorus, Ur: 789 mg/(24.h) (ref 600–1200)
Potassium, Urine: 50 mmol/(24.h) (ref 20–100)
Protein Catabolic Rate, U: 1.1 g/kg/d (ref 0.8–1.4)
Sodium, Ur: 162 mmol/(24.h) — ABNORMAL HIGH (ref 50–150)
Sulfate Urine: 34 meq/(24.h) (ref 20–80)
Urea Nitrogen, Ur: 12.18 g/(24.h) (ref 6.00–14.00)
Uric Acid Saturation: 0.05 (ref <1.00)
Uric Acid, Ur: 596 mg/(24.h) (ref <750)
Urine Volume (Preservative): 1730 mL/(24.h) (ref 500–4000)
pH, 24 Hr Urine: 7.234 — ABNORMAL HIGH (ref 5.800–6.200)

## 2023-05-10 LAB — AMB POC URINALYSIS DIP STICK AUTO W/O MICRO
Bilirubin, Urine, POC: NEGATIVE
Blood, Urine, POC: NEGATIVE
Glucose, Urine, POC: NEGATIVE
Ketones, Urine, POC: NEGATIVE
Leukocyte Esterase, Urine, POC: NEGATIVE
Nitrite, Urine, POC: NEGATIVE
Protein, Urine, POC: NEGATIVE
Specific Gravity, Urine, POC: 1.015 (ref 1.001–1.035)
Urobilinogen, POC: 0.2
pH, Urine, POC: 7 (ref 4.6–8.0)

## 2023-05-10 NOTE — Progress Notes (Signed)
Doris Roach is a 58 y.o. female presents today for a straight catheter insertion for urine culture per Dr. Maple Hudson order.   Dr. Henrene Hawking was present in the clinic as incident to.     Temp 97.6 F (36.4 C)   Ht 1.651 m (5\' 5" )   Wt 83.5 kg (184 lb)   BMI 30.62 kg/m      Procedure:   The genital and perineal areas were cleansed with antiseptic solution on the cotton balls.  With clean gloves the area was cleansed with antiseptic solution again.  I applied sterile lubricant liberally to the catheter tip, lubricating at least six inches of the catheter.  A 10 fr cath catheter was inserted into the urinary meatus. When urine started to flow, I inserted the catheter approximately one inch further without difficulty and bladder was drained.        Difficulty inserting catheter?NO, if yes explain:  Was more than one catheter used? NO, if so, explain?  Blood clots:NO, if so , what size ?  Catheter size:  22F  Type:  Straight-tip   Material:  All Silicone      Urine was:  clear    Specimen was obtained:  YES        Results for orders placed or performed in visit on 05/08/23   AMB POC URINALYSIS DIP STICK AUTO W/O MICRO   Result Value Ref Range    Color, Urine, POC Yellow     Clarity, Urine, POC Clear     Glucose, Urine, POC Negative     Bilirubin, Urine, POC Negative     Ketones, Urine, POC Negative     Specific Gravity, Urine, POC 1.015 1.001 - 1.035    Blood, Urine, POC Negative Negative    pH, Urine, POC 7.0 4.6 - 8.0    Protein, Urine, POC Negative     Urobilinogen, POC 0.2 mg/dL     Nitrite, Urine, POC Negative     Leukocyte Esterase, Urine, POC Negative        Orders Placed This Encounter   Procedures    Urine C&S     Error last week sample never sent to culture I called about this and was informed ok to process in house to get results back prior to surgery on 05/12/2023.     Order Specific Question:   Specify all ANTIBIOTIC ALLERGIES:     Answer:   No Known Antibiotic Allergies     Order Specific Question:    Specify the urine source     Answer:   Straight Cath In/Out    AMB POC URINALYSIS DIP STICK AUTO W/O MICRO    PR STRAIGHT TIP URINE CATHETER       Laquasia Pincus D, MA     I was present in clinic as incident to provider    [place] A Goldwag, MD

## 2023-05-10 NOTE — Other (Signed)
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

## 2023-05-11 ENCOUNTER — Telehealth

## 2023-05-11 ENCOUNTER — Inpatient Hospital Stay: Admit: 2023-05-12 | Payer: PRIVATE HEALTH INSURANCE | Primary: Physician Assistant

## 2023-05-11 DIAGNOSIS — N200 Calculus of kidney: Secondary | ICD-10-CM

## 2023-05-11 LAB — URINE C&S

## 2023-05-11 NOTE — Telephone Encounter (Signed)
Postop FU Note  Date of Surgery: 05/11/2023    Procedure: L ureteroscopy, LL. Stone extraction    Findings:  4 large intrarenal stones (3 fragmented, 1 removed intact)    Plan:  Cysto stent removal 1-2 weeks  FU 3 mo w/ metabolic evaluation and UD    Laurier Nancy, MD

## 2023-05-16 NOTE — Addendum Note (Signed)
Addended by: Milinda Cave D on: 05/16/2023 04:38 PM     Modules accepted: Orders

## 2023-05-16 NOTE — Telephone Encounter (Addendum)
[employer] sent.

## 2023-05-17 NOTE — Progress Notes (Signed)
Patient seen on 05/08/23 instead of 05/05/23. Encounter created in error    Doris Roach

## 2023-05-18 NOTE — Addendum Note (Signed)
Addended by: Milinda Cave D on: 05/18/2023 12:20 PM     Modules accepted: Orders

## 2023-05-18 NOTE — Telephone Encounter (Signed)
Note added to chart to schedule 3 month post op and Rus

## 2023-05-19 ENCOUNTER — Ambulatory Visit: Admit: 2023-05-19 | Discharge: 2023-05-19 | Payer: PRIVATE HEALTH INSURANCE | Primary: Physician Assistant

## 2023-05-19 VITALS — Temp 97.20000°F | Resp 16 | Ht 65.0 in | Wt 184.0 lb

## 2023-05-19 DIAGNOSIS — N200 Calculus of kidney: Secondary | ICD-10-CM

## 2023-05-19 LAB — AMB POC URINALYSIS DIP STICK AUTO W/O MICRO
Nitrite, Urine, POC: NEGATIVE
Specific Gravity, Urine, POC: 1.015 (ref 1.001–1.035)
Urobilinogen, POC: 1
pH, Urine, POC: 7.5 (ref 4.6–8.0)

## 2023-05-19 LAB — STONE ANALYSIS: Stone weight: 0.206 g

## 2023-05-19 MED ORDER — CEPHALEXIN 500 MG PO CAPS
500 | ORAL_CAPSULE | Freq: Once | ORAL | 0 refills | Status: AC
Start: 2023-05-19 — End: 2023-05-19

## 2023-05-19 NOTE — Progress Notes (Signed)
.    I am following the established plan for Dr. Maple Hudson and Dr. Louretta Parma is the supervising physician for this day.     Doris Roach  DOB: Dec 08, 1965  Encounter Date: 05/19/2023       ICD-10-CM    1. Kidney stones  N20.0 AMB POC URINALYSIS DIP STICK AUTO W/O MICRO     CYSTOSCOPY,REMV CALCULUS,SIMPLE     Culture, Urine      2. Ureteral stent present  Z96.0                  ASSESSMENT:  - Nephrolithiasis.   Surgical History:  S/p right ESWL in [place] 07/09/12  S/p cysto, left URS/LL, left jj stent with Dr. 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 UCx sent.   Stent removed intact without complication  Advised patient to push oral fluids, alt Tylenol/Motrin PRN for pains  Advised patient of strict ER precautions, including fevers, intractable N/V, or pain that cannot be controlled with oral medications. Pt verbalized understanding.  Reviewed stone analysis  Reviewed [facility]  Recommend patient increase urine output to 2-2.5L daily, increase citrate in diet (lemons/limes in water, Crystal Lite lemonade), follow normal sodium diet <2300 mg daily  Follow up 2-3 months with RUS prior with Dr. Maple Hudson or Rosalita Chessman. Or sooner PRN.        DISCUSSION:  The patient may experience gross hematuria and dysuria s/p stent removal. This should be resolved in 3-4 days. If it continues patient is to contact the office for possible UTI management. Acute flank pain warrants an ER visit.         Chief Complaint   Patient presents with    Cystoscopy     Stent removal         HISTORY OF PRESENT ILLNESS:   Doris Roach is a 58 y.o. female who  presents today in follow up for kidney stones. She is s/p cysto, left URS/LL, left jj stent with Dr. Maple Hudson on 05/11/23. Presents today for cysto, stent removal.     Patient doing well. She has competed abx and run out of Flomax. Notes some left flank discomfort. Was seeing some blood in the urine on and off - saw today as she ws more active yesterday. Denies dysuria, SP pains, n/v/f/c.     She completed [facility] prior to stone surgery and would like to review today.       CT AP 03/30/23  1. LEFT four kidney stones (up to 7 mm). No hydronephrosis.   2. Mild colonic stool burden.       Initial hx.  Patient doing well today. She has a prior history of kidney stones. S/p ESWL ~10 years ago. More recently in July, she woke up with nausea and vomiting, right flank pains, and malaise that felt like a prior stone episode. She went to [facility] who diagnosed her with a kidney tone that should likely pass, however no imaging was performed. She was discharged with nausea medications and referred to urology.  She notes that the symptoms lasted for ~2 days and subsided. Today, denies flank pains, dysuria, gross hematuria, n/v/f/c.     Denies family history of kidney stones.    Denies IBS, IBD, gastric bypass, gout, DM  Has history of gastric ulcers  Denies Topamax use    Denies blood thinners    Denies menses  S/p hysterectomy in 2008        History reviewed. No pertinent past medical history.  Past Surgical History:   Procedure Laterality Date    UROLOGICAL SURGERY  05/11/2023    CYSTOSCOPY,LEFT URETEROSCOPY, RETROGRADE PYELOGRAM, LASER LITHOTRIPSY,AND LEFT STENT PLACEMENT;     Laurier Nancy, MD     History reviewed. No pertinent family history.  Social History     Socioeconomic History    Marital status: Married     Spouse name: Not on file    Number of children: Not on file    Years of education: Not on file    Highest education level: Not on file   Occupational History    Not on file   Tobacco Use    Smoking status: Never     Smokeless tobacco: Never   Substance and Sexual Activity    Alcohol use: Yes    Drug use: Never    Sexual activity: Not on file   Other Topics Concern    Not on file   Social History Narrative    Not on file     Social Determinants of Health     Financial Resource Strain: Not on file   Food Insecurity: Not on file   Transportation Needs: Not on file   Physical Activity: Not on file   Stress: Not on file   Social Connections: Not on file   Intimate Partner Violence: Not on file   Housing Stability: Not on file     No Known Allergies  Current Outpatient Medications   Medication Sig Dispense Refill    cephALEXin (KEFLEX) 500 MG capsule Take 1 capsule by mouth once for 1 dose 1 capsule 0    sertraline (ZOLOFT) 100 MG tablet Take 1 tablet by mouth daily      omeprazole (PRILOSEC) 40 MG delayed release capsule Take by mouth daily      amLODIPine (NORVASC) 5 MG tablet TAKE 1 TABLET EVERY DAY BY ORAL ROUTE, FOR HTN.      cetirizine (ZYRTEC) 10 MG tablet Take 1 tablet by mouth daily      losartan-hydroCHLOROthiazide (HYZAAR) 100-25 MG per tablet Take 1 tablet by mouth daily      losartan (COZAAR) 50 MG tablet Take 1 tablet by mouth daily       No current facility-administered medications for this visit.         Physical Examination  Temp 97.2 F (36.2 C)   Resp 16   Ht 1.651 m (5\' 5" )   Wt 83.5 kg (184 lb)   BMI 30.62 kg/m   Constitutional: Well developed, well-nourished female in no acute distress.   CV:  No peripheral swelling noted  Respiratory: No respiratory distress or difficulties  Abdomen:  Soft and nontender. No masses.    GU Female:  No CVA tenderness.    Skin: No bruising or rashes.  No petechia.    Neuro/Psych:  Patient with appropriate affect.  Alert and oriented.        Labs/Radiology:  Results for orders placed or performed in visit on 05/19/23   AMB POC URINALYSIS DIP STICK AUTO  W/O MICRO   Result Value Ref Range    Color, Urine, POC Yellow     Clarity, Urine, POC Clear     Glucose, Urine, POC None      Bilirubin, Urine, POC None     Ketones, Urine, POC None     Specific Gravity, Urine, POC 1.015 1.001 - 1.035    Blood, Urine, POC 3+ Negative    pH, Urine, POC 7.5 4.6 - 8.0    Protein, Urine, POC 2+     Urobilinogen, POC 1 mg/dL     Nitrite, Urine, POC Negative     Leukocyte Esterase, Urine, POC 1+            CC: Lennox Grumbles, PA-C     Helane Gunther, PA-C  [facility]  [address]   [place], [place] [address]  [phone] (office)      Please note that portions of this note were completed with a voice recognition program.  Efforts were made to edit the dictations but occasionally words are mis-transcribed.

## 2023-05-19 NOTE — Progress Notes (Signed)
I am following the established plan for Dr. Maple Hudson and Dr. Louretta Parma is the supervising physician for this day.        Doris Roach  DOB 1966/03/03  Encounter date 05/19/2023    CYSTOSCOPY STENT REMOVAL PROCEDURE      Pre-procedure Diagnosis:     ICD-10-CM    1. Kidney stones  N20.0 AMB POC URINALYSIS DIP STICK AUTO W/O MICRO     CYSTOSCOPY,REMV CALCULUS,SIMPLE     Culture, Urine      2. Ureteral stent present  Z96.0             Post-procedure Diagnosis: same    Consent:  All risks, benefits and options were reviewed in detail and the patient agrees to procedure. Risks include but are not limited to bleeding, infection, sepsis, death, dysuria and others.     Universal Procedure Pause:    1. Correct patient confirmed:  Yes  2. Allergies confirmed:  Yes  3. Correct procedure and side confirmed and consent signed:  Yes  4. Correct antibiotics confirmed:  Yes    Procedure:  The patient was placed in the supine position, and prepped and draped in the normal fashion. 5 ml of 4% Lidocaine gel was placed in the urethra. Once adequate anesthesia was achieved; the flexible cystoscope was placed into the bladder.     Findings as follows:    Meatus: normal   Urethra: normal   Bladder neck: normal   Trigone:  normal   Lesion: none      Left ureteral stent identified in good position, grasped and removed intact.     Antibiotic provided:  Yes - Keflex 500 mg PO x1    Lab / Imaging:   Results for orders placed or performed in visit on 05/19/23   AMB POC URINALYSIS DIP STICK AUTO W/O MICRO   Result Value Ref Range    Color, Urine, POC Yellow     Clarity, Urine, POC Clear     Glucose, Urine, POC None     Bilirubin, Urine, POC None     Ketones, Urine, POC None     Specific Gravity, Urine, POC 1.015 1.001 - 1.035    Blood, Urine, POC 3+ Negative    pH, Urine, POC 7.5 4.6 - 8.0    Protein, Urine, POC 2+     Urobilinogen, POC 1 mg/dL     Nitrite, Urine, POC Negative     Leukocyte Esterase, Urine, POC 1+          Diagnoses:      ICD-10-CM    1. Kidney stones  N20.0 AMB POC URINALYSIS DIP STICK AUTO W/O MICRO     CYSTOSCOPY,REMV CALCULUS,SIMPLE     Culture, Urine      2. Ureteral stent present  Z96.0               SUMMARY OF VISIT AND PLAN:  See separate progress note      Craig Staggers, PA-C  [facility]  [address]   [place], [place] [address]  [phone] (office)

## 2023-05-21 LAB — CULTURE, URINE

## 2023-05-22 NOTE — Other (Signed)
Clinically insignificant urine culture

## 2023-06-06 ENCOUNTER — Encounter: Primary: Physician Assistant

## 2023-06-12 ENCOUNTER — Inpatient Hospital Stay: Admit: 2023-06-12 | Payer: PRIVATE HEALTH INSURANCE | Primary: Physician Assistant

## 2023-06-12 DIAGNOSIS — N6489 Other specified disorders of breast: Secondary | ICD-10-CM

## 2023-06-12 DIAGNOSIS — R928 Other abnormal and inconclusive findings on diagnostic imaging of breast: Secondary | ICD-10-CM

## 2023-08-03 ENCOUNTER — Inpatient Hospital Stay: Admit: 2023-08-04 | Payer: PRIVATE HEALTH INSURANCE | Primary: Physician Assistant

## 2023-08-03 ENCOUNTER — Inpatient Hospital Stay: Admit: 2023-08-03 | Payer: PRIVATE HEALTH INSURANCE | Primary: Physician Assistant

## 2023-08-03 DIAGNOSIS — R002 Palpitations: Secondary | ICD-10-CM

## 2023-08-04 LAB — MAGNESIUM: Magnesium: 2.3 mg/dL (ref 1.6–2.6)

## 2023-08-04 LAB — BASIC METABOLIC PANEL
Anion Gap: 9 mmol/L (ref 5–15)
BUN: 19 mg/dL (ref 9–23)
CO2: 30 meq/L (ref 20–31)
Calcium: 9.8 mg/dL (ref 8.7–10.4)
Chloride: 100 meq/L (ref 98–107)
Creatinine: 0.8 mg/dL (ref 0.55–1.02)
GFR African American: >60
GFR Non-African American: >60
Glucose: 73 mg/dL — ABNORMAL LOW (ref 74–106)
Potassium: 4 meq/L (ref 3.5–5.1)
Sodium: 139 meq/L (ref 136–145)

## 2023-08-04 LAB — CBC WITH AUTO DIFFERENTIAL
Basophils: 0.4 % (ref 0–3)
Eosinophils: 2.2 % (ref 0–5)
Hematocrit: 42.1 % (ref 35.0–47.0)
Hemoglobin: 13.1 g/dL (ref 11.0–16.0)
Immature Granulocytes %: 0.2 % (ref 0.0–3.0)
Lymphocytes: 39 % (ref 28–48)
MCH: 31.2 pg (ref 25.4–34.6)
MCHC: 31.1 g/dL (ref 30.0–36.0)
MCV: 100.2 fL — ABNORMAL HIGH (ref 80.0–98.0)
MPV: 11.5 fL — ABNORMAL HIGH (ref 6.0–10.0)
Monocytes: 10.2 % (ref 1–13)
Neutrophils Segmented: 48 % (ref 34–64)
Nucleated RBCs: 0 (ref 0–0)
Platelets: 173 10*3/uL (ref 140–450)
RBC: 4.2 M/uL (ref 3.60–5.20)
RDW: 47 — ABNORMAL HIGH (ref 36.4–46.3)
WBC: 5 10*3/uL (ref 4.0–11.0)

## 2023-08-04 LAB — T4, FREE: T4 Free: 1.01 ng/dL (ref 0.89–1.76)

## 2023-08-04 LAB — TSH: TSH, High Sensitivity: 0.867 u[IU]/mL (ref 0.550–4.780)

## 2023-08-22 ENCOUNTER — Ambulatory Visit: Admit: 2023-08-22 | Discharge: 2023-08-23 | Payer: PRIVATE HEALTH INSURANCE | Primary: Physician Assistant

## 2023-08-22 DIAGNOSIS — N200 Calculus of kidney: Secondary | ICD-10-CM

## 2023-08-22 NOTE — Progress Notes (Signed)
 Renal ultrasound completed per office protocol.

## 2023-09-01 ENCOUNTER — Ambulatory Visit: Admit: 2023-09-01 | Discharge: 2023-09-01 | Payer: PRIVATE HEALTH INSURANCE | Primary: Physician Assistant

## 2023-09-01 VITALS — Ht 65.0 in | Wt 189.0 lb

## 2023-09-01 DIAGNOSIS — N200 Calculus of kidney: Secondary | ICD-10-CM

## 2023-09-01 LAB — AMB POC URINALYSIS DIP STICK AUTO W/O MICRO
Bilirubin, Urine, POC: NEGATIVE
Blood, Urine, POC: NEGATIVE
Glucose, Urine, POC: NEGATIVE
Ketones, Urine, POC: NEGATIVE
Nitrite, Urine, POC: NEGATIVE
Protein, Urine, POC: NEGATIVE
Specific Gravity, Urine, POC: 1.005 (ref 1.001–1.035)
Urobilinogen, POC: 0.2
pH, Urine, POC: 5.5 (ref 4.6–8.0)

## 2023-09-01 NOTE — Progress Notes (Signed)
 .    I am following the established plan for Dr. Linder Revere and Dr. Linder Revere is the supervising physician for this day.     Doris Roach  DOB: 09/06/1965  Encounter Date: 09/01/2023       ICD-10-CM    1. Kidney stone  N20.0 AMB POC URINALYSIS DIP STICK AUTO W/O MICRO                   ASSESSMENT:  - Nephrolithiasis.   Surgical History:  [place]SW[place] in [place] 07/09/12  S/p cysto, left URS/LL, left jj stent with Dr. Linder Revere on 05/11/23     Medical Therapy: HCTZ 25 mg daily (combo with losartan)  Stone Composition: 05/11/23: 85% CaOx, 15% Carbonate Apatite   24 Urine Panel: 05/03/23: mildly low urine volume (1.73L), mild hypocitraturia (499), mildly elevated sodium (162)     Last Imaging:   CT 03/30/23: 4 nonobstructing left renal stones measuring 3 mm, 4 mm, 6 mm, and 8 mm varying between the lower to mid/upper pole   RUS 08/22/23:Bilateral prominent pelvis. Bilateral column of Bertin. Left cortical calcifications. Bladder normal. Bilateral urine jets noted.     PLAN:  UA today:  1+ leuks. Otherwise negative  Reviewed RUS:  hydro resolved  Recommend patient increase urine output to 2-2.5L daily, increase citrate in diet (lemons/limes in water, Crystal Lite lemonade), follow normal sodium diet <2300 mg daily  follow up in 1 year with [facility]      DISCUSSION:  The patient may experience gross hematuria and dysuria s/p stent removal. This should be resolved in 3-4 days. If it continues patient is to contact the office for possible UTI management. Acute flank pain warrants an ER visit.         Chief Complaint   Patient presents with    Nephrolithiasis     Followup RUS       HISTORY OF PRESENT ILLNESS:   Doris Roach is a 58 y.o. female who presents today in follow up for kidney stones. She is s/p cysto, left URS/LL, left jj stent with Dr. Linder Revere on 05/11/23. Presents today for cysto, stent removal.     The pt is doing well.  No interim stone episodes. .Denies dysuria, gross hematuria, flank pain or n/v/f/c.        CT AP  03/30/23  1. LEFT four kidney stones (up to 7 mm). No hydronephrosis.   2. Mild colonic stool burden.       Initial hx.  Patient doing well today. She has a prior history of kidney stones. S/p ESWL ~10 years ago. More recently in July, she woke up with nausea and vomiting, right flank pains, and malaise that felt like a prior stone episode. She went to [facility] who diagnosed her with a kidney tone that should likely pass, however no imaging was performed. She was discharged with nausea medications and referred to urology. She notes that the symptoms lasted for ~2 days and subsided. Today, denies flank pains, dysuria, gross hematuria, n/v/f/c.     Denies family history of kidney stones.    Denies IBS, IBD, gastric bypass, gout, DM  Has history of gastric ulcers  Denies Topamax use    Denies blood thinners    Denies menses  S/p hysterectomy in 2008        Past Medical History:   Diagnosis Date    Hypertension      Past Surgical History:   Procedure Laterality Date  UROLOGICAL SURGERY  05/11/2023    CYSTOSCOPY,LEFT URETEROSCOPY, RETROGRADE PYELOGRAM, LASER LITHOTRIPSY,AND LEFT STENT PLACEMENT;     Evie Hoff, MD     History reviewed. No pertinent family history.  Social History     Socioeconomic History    Marital status: Married     Spouse name: Not on file    Number of children: Not on file    Years of education: Not on file    Highest education level: Not on file   Occupational History    Not on file   Tobacco Use    Smoking status: Never    Smokeless tobacco: Never   Substance and Sexual Activity    Alcohol use: Yes    Drug use: Never    Sexual activity: Not on file   Other Topics Concern    Not on file   Social History Narrative    Not on file     Social Drivers of Health     Financial Resource Strain: Not on file   Food Insecurity: Not on file   Transportation Needs: Not on file   Physical Activity: Not on file   Stress: Not on file   Social Connections: Not on file   Intimate Partner Violence: Not on  file   Housing Stability: Not on file     No Known Allergies  Current Outpatient Medications   Medication Sig Dispense Refill    sertraline (ZOLOFT) 100 MG tablet Take 1 tablet by mouth daily      omeprazole (PRILOSEC) 40 MG delayed release capsule Take by mouth daily      amLODIPine (NORVASC) 5 MG tablet TAKE 1 TABLET EVERY DAY BY ORAL ROUTE, FOR HTN.      cetirizine (ZYRTEC) 10 MG tablet Take 1 tablet by mouth daily      losartan-hydroCHLOROthiazide (HYZAAR) 100-25 MG per tablet Take 1 tablet by mouth daily       No current facility-administered medications for this visit.         Physical Examination  Ht 1.651 m (5\' 5" )   Wt 85.7 kg (189 lb)   BMI 31.45 kg/m   Constitutional: Well developed, well-nourished female in no acute distress.   CV:  No peripheral swelling noted  Respiratory: No respiratory distress or difficulties  Abdomen:  Soft and nontender. No masses.    GU Female:  No CVA tenderness.    Skin: No bruising or rashes.  No petechia.    Neuro/Psych:  Patient with appropriate affect.  Alert and oriented.        Labs/Radiology:  Results for orders placed or performed in visit on 09/01/23   AMB POC URINALYSIS DIP STICK AUTO W/O MICRO   Result Value Ref Range    Color, Urine, POC Yellow     Clarity, Urine, POC Clear     Glucose, Urine, POC Negative     Bilirubin, Urine, POC Negative     Ketones, Urine, POC Negative     Specific Gravity, Urine, POC 1.005 1.001 - 1.035    Blood, Urine, POC Negative Negative    pH, Urine, POC 5.5 4.6 - 8.0    Protein, Urine, POC Negative     Urobilinogen, POC 0.2 mg/dL     Nitrite, Urine, POC Negative     Leukocyte Esterase, Urine, POC 1+              CC: Jolly Needle, PA-C   Leverne Reading, NP-C  [facility]  [address]   [place]  [place], [place] [address]  [phone] (office)

## 2024-01-02 ENCOUNTER — Inpatient Hospital Stay
Admit: 2024-01-02 | Payer: PRIVATE HEALTH INSURANCE | Primary: Student in an Organized Health Care Education/Training Program

## 2024-01-02 ENCOUNTER — Inpatient Hospital Stay: Admit: 2024-01-02 | Payer: PRIVATE HEALTH INSURANCE | Primary: Physician Assistant

## 2024-01-02 DIAGNOSIS — Z0183 Encounter for blood typing: Principal | ICD-10-CM

## 2024-01-02 LAB — COMPREHENSIVE METABOLIC PANEL
ALT: 18 U/L (ref 10–49)
AST: 25 U/L (ref 0.0–33.9)
Albumin: 3.9 g/dL (ref 3.4–5.0)
Alkaline Phosphatase: 76 U/L (ref 46–116)
Anion Gap: 9 mmol/L (ref 5–15)
BUN: 12 mg/dL (ref 9–23)
CO2: 31 meq/L (ref 20–31)
Calcium: 9.4 mg/dL (ref 8.7–10.4)
Chloride: 103 meq/L (ref 98–107)
Creatinine: 0.68 mg/dL (ref 0.55–1.02)
GFR African American: >60
GFR Non-African American: >60
Glucose: 79 mg/dL (ref 74–106)
Potassium: 3.9 meq/L (ref 3.5–5.1)
Sodium: 143 meq/L (ref 136–145)
Total Bilirubin: 0.8 mg/dL (ref 0.30–1.20)
Total Protein: 7.1 g/dL (ref 5.7–8.2)

## 2024-01-02 LAB — LIPID PANEL
Chol/HDL Ratio: 3 ratio (ref 0.0–4.4)
Cholesterol, Total: 202 mg/dL — ABNORMAL HIGH (ref 0–199)
HDL: 68 mg/dL — ABNORMAL HIGH (ref 40–60)
LDL Cholesterol: 124 mg/dL (ref 0–130)
Triglycerides: 48 mg/dL (ref 0–150)

## 2024-01-02 LAB — TSH: TSH, High Sensitivity: 0.57 u[IU]/mL (ref 0.55–4.78)

## 2024-01-02 LAB — ESTRADIOL: Estradiol: 16.49 pg/mL — ABNORMAL LOW (ref 19.50–356.70)

## 2024-01-02 LAB — HEMOGLOBIN A1C: Hemoglobin A1C: 5.1 % (ref 3.8–5.6)

## 2024-01-03 LAB — ABO/RH: ABO/Rh: O POS

## 2024-01-05 LAB — FOLLICLE STIMULATING HORMONE: FSH: 73.8 m[IU]/mL

## 2024-01-08 NOTE — Addendum Note (Signed)
 Addended by: JOSHUA IHA D on: 01/08/2024 11:02 AM     Modules accepted: Level of Service

## 2024-05-22 ENCOUNTER — Encounter

## 2024-05-28 ENCOUNTER — Inpatient Hospital Stay
Admit: 2024-05-28 | Payer: PRIVATE HEALTH INSURANCE | Attending: Student in an Organized Health Care Education/Training Program | Primary: Student in an Organized Health Care Education/Training Program
